# Patient Record
Sex: Male | Born: 1945 | Race: White | Hispanic: No | Marital: Married | Smoking: Never smoker
Health system: Southern US, Community
[De-identification: ages and names within clinical notes are randomized; demographics above are authoritative.]

## PROBLEM LIST (undated history)

## (undated) DIAGNOSIS — E669 Obesity, unspecified: Secondary | ICD-10-CM

## (undated) DIAGNOSIS — E785 Hyperlipidemia, unspecified: Secondary | ICD-10-CM

## (undated) DIAGNOSIS — G4730 Sleep apnea, unspecified: Secondary | ICD-10-CM

## (undated) DIAGNOSIS — E079 Disorder of thyroid, unspecified: Secondary | ICD-10-CM

## (undated) DIAGNOSIS — E119 Type 2 diabetes mellitus without complications: Secondary | ICD-10-CM

## (undated) DIAGNOSIS — K219 Gastro-esophageal reflux disease without esophagitis: Secondary | ICD-10-CM

## (undated) DIAGNOSIS — I10 Essential (primary) hypertension: Secondary | ICD-10-CM

## (undated) HISTORY — DX: Disorder of thyroid, unspecified: E07.9

## (undated) HISTORY — DX: Essential (primary) hypertension: I10

## (undated) HISTORY — DX: Hyperlipidemia, unspecified: E78.5

## (undated) HISTORY — PX: OTHER SURGICAL HISTORY: SHX169

## (undated) HISTORY — DX: Gastro-esophageal reflux disease without esophagitis: K21.9

## (undated) HISTORY — DX: Type 2 diabetes mellitus without complications: E11.9

## (undated) HISTORY — DX: Sleep apnea, unspecified: G47.30

## (undated) HISTORY — DX: Obesity, unspecified: E66.9

---

## 1998-01-16 ENCOUNTER — Ambulatory Visit (HOSPITAL_COMMUNITY): Admission: RE | Admit: 1998-01-16 | Discharge: 1998-01-16 | Payer: Self-pay | Admitting: Family Medicine

## 1998-01-16 ENCOUNTER — Encounter: Payer: Self-pay | Admitting: Family Medicine

## 2000-04-04 ENCOUNTER — Ambulatory Visit (HOSPITAL_COMMUNITY): Admission: RE | Admit: 2000-04-04 | Discharge: 2000-04-04 | Payer: Self-pay | Admitting: Family Medicine

## 2000-04-04 ENCOUNTER — Encounter: Payer: Self-pay | Admitting: Family Medicine

## 2001-08-15 ENCOUNTER — Encounter: Payer: Self-pay | Admitting: Family Medicine

## 2001-08-15 ENCOUNTER — Ambulatory Visit (HOSPITAL_COMMUNITY): Admission: RE | Admit: 2001-08-15 | Discharge: 2001-08-15 | Payer: Self-pay | Admitting: Family Medicine

## 2002-02-22 ENCOUNTER — Encounter: Payer: Self-pay | Admitting: Family Medicine

## 2002-02-22 ENCOUNTER — Ambulatory Visit (HOSPITAL_COMMUNITY): Admission: RE | Admit: 2002-02-22 | Discharge: 2002-02-22 | Payer: Self-pay | Admitting: Family Medicine

## 2003-12-02 ENCOUNTER — Ambulatory Visit (HOSPITAL_COMMUNITY): Admission: RE | Admit: 2003-12-02 | Discharge: 2003-12-02 | Payer: Self-pay | Admitting: Family Medicine

## 2004-03-06 ENCOUNTER — Ambulatory Visit (HOSPITAL_COMMUNITY): Admission: RE | Admit: 2004-03-06 | Discharge: 2004-03-06 | Payer: Self-pay | Admitting: Family Medicine

## 2004-03-25 ENCOUNTER — Ambulatory Visit (HOSPITAL_COMMUNITY): Admission: RE | Admit: 2004-03-25 | Discharge: 2004-03-25 | Payer: Self-pay | Admitting: Gastroenterology

## 2004-10-25 ENCOUNTER — Encounter: Admission: RE | Admit: 2004-10-25 | Discharge: 2004-10-25 | Payer: Self-pay | Admitting: Interventional Cardiology

## 2004-11-01 ENCOUNTER — Inpatient Hospital Stay (HOSPITAL_BASED_OUTPATIENT_CLINIC_OR_DEPARTMENT_OTHER): Admission: RE | Admit: 2004-11-01 | Discharge: 2004-11-01 | Payer: Self-pay | Admitting: Interventional Cardiology

## 2005-01-14 ENCOUNTER — Ambulatory Visit: Payer: Self-pay | Admitting: Pulmonary Disease

## 2005-03-02 ENCOUNTER — Ambulatory Visit: Payer: Self-pay | Admitting: Pulmonary Disease

## 2005-03-07 ENCOUNTER — Ambulatory Visit: Admission: RE | Admit: 2005-03-07 | Discharge: 2005-03-07 | Payer: Self-pay | Admitting: Pulmonary Disease

## 2005-03-08 ENCOUNTER — Ambulatory Visit: Payer: Self-pay | Admitting: Cardiology

## 2005-03-17 ENCOUNTER — Ambulatory Visit: Payer: Self-pay | Admitting: Pulmonary Disease

## 2005-07-11 ENCOUNTER — Ambulatory Visit: Payer: Self-pay | Admitting: Pulmonary Disease

## 2011-05-02 DIAGNOSIS — H04129 Dry eye syndrome of unspecified lacrimal gland: Secondary | ICD-10-CM | POA: Diagnosis not present

## 2011-05-02 DIAGNOSIS — E119 Type 2 diabetes mellitus without complications: Secondary | ICD-10-CM | POA: Diagnosis not present

## 2012-10-25 ENCOUNTER — Other Ambulatory Visit (HOSPITAL_COMMUNITY): Payer: Self-pay | Admitting: Interventional Cardiology

## 2012-10-25 ENCOUNTER — Ambulatory Visit (HOSPITAL_COMMUNITY): Payer: BC Managed Care – PPO | Attending: Interventional Cardiology | Admitting: Radiology

## 2012-10-25 DIAGNOSIS — R0609 Other forms of dyspnea: Secondary | ICD-10-CM | POA: Insufficient documentation

## 2012-10-25 DIAGNOSIS — R0989 Other specified symptoms and signs involving the circulatory and respiratory systems: Secondary | ICD-10-CM | POA: Insufficient documentation

## 2012-10-25 DIAGNOSIS — R0602 Shortness of breath: Secondary | ICD-10-CM

## 2012-10-25 NOTE — Progress Notes (Signed)
Echocardiogram performed.  

## 2013-04-19 DIAGNOSIS — L821 Other seborrheic keratosis: Secondary | ICD-10-CM | POA: Diagnosis not present

## 2013-04-19 DIAGNOSIS — D239 Other benign neoplasm of skin, unspecified: Secondary | ICD-10-CM | POA: Diagnosis not present

## 2013-04-19 DIAGNOSIS — M6740 Ganglion, unspecified site: Secondary | ICD-10-CM | POA: Diagnosis not present

## 2013-04-19 DIAGNOSIS — L819 Disorder of pigmentation, unspecified: Secondary | ICD-10-CM | POA: Diagnosis not present

## 2013-04-19 DIAGNOSIS — D1801 Hemangioma of skin and subcutaneous tissue: Secondary | ICD-10-CM | POA: Diagnosis not present

## 2013-04-19 DIAGNOSIS — L738 Other specified follicular disorders: Secondary | ICD-10-CM | POA: Diagnosis not present

## 2013-08-13 DIAGNOSIS — E119 Type 2 diabetes mellitus without complications: Secondary | ICD-10-CM | POA: Diagnosis not present

## 2013-08-13 DIAGNOSIS — R609 Edema, unspecified: Secondary | ICD-10-CM | POA: Diagnosis not present

## 2013-08-13 DIAGNOSIS — E039 Hypothyroidism, unspecified: Secondary | ICD-10-CM | POA: Diagnosis not present

## 2013-08-13 DIAGNOSIS — E785 Hyperlipidemia, unspecified: Secondary | ICD-10-CM | POA: Diagnosis not present

## 2013-08-13 DIAGNOSIS — Z6841 Body Mass Index (BMI) 40.0 and over, adult: Secondary | ICD-10-CM | POA: Diagnosis not present

## 2013-08-13 DIAGNOSIS — E669 Obesity, unspecified: Secondary | ICD-10-CM | POA: Diagnosis not present

## 2013-08-26 DIAGNOSIS — L03119 Cellulitis of unspecified part of limb: Secondary | ICD-10-CM | POA: Diagnosis not present

## 2013-08-26 DIAGNOSIS — L02419 Cutaneous abscess of limb, unspecified: Secondary | ICD-10-CM | POA: Diagnosis not present

## 2013-09-03 DIAGNOSIS — J309 Allergic rhinitis, unspecified: Secondary | ICD-10-CM | POA: Diagnosis not present

## 2013-09-03 DIAGNOSIS — M109 Gout, unspecified: Secondary | ICD-10-CM | POA: Diagnosis not present

## 2013-09-03 DIAGNOSIS — L02419 Cutaneous abscess of limb, unspecified: Secondary | ICD-10-CM | POA: Diagnosis not present

## 2013-09-03 DIAGNOSIS — I10 Essential (primary) hypertension: Secondary | ICD-10-CM | POA: Diagnosis not present

## 2013-12-03 DIAGNOSIS — Z23 Encounter for immunization: Secondary | ICD-10-CM | POA: Diagnosis not present

## 2013-12-06 ENCOUNTER — Encounter: Payer: Self-pay | Admitting: Interventional Cardiology

## 2013-12-06 ENCOUNTER — Ambulatory Visit (INDEPENDENT_AMBULATORY_CARE_PROVIDER_SITE_OTHER): Payer: Medicare Other | Admitting: Interventional Cardiology

## 2013-12-06 VITALS — BP 114/80 | HR 55 | Ht 72.0 in | Wt 315.1 lb

## 2013-12-06 DIAGNOSIS — G4733 Obstructive sleep apnea (adult) (pediatric): Secondary | ICD-10-CM | POA: Insufficient documentation

## 2013-12-06 DIAGNOSIS — I10 Essential (primary) hypertension: Secondary | ICD-10-CM

## 2013-12-06 DIAGNOSIS — I452 Bifascicular block: Secondary | ICD-10-CM | POA: Insufficient documentation

## 2013-12-06 MED ORDER — AMLODIPINE BESYLATE 5 MG PO TABS: 5.0000 mg | ORAL_TABLET | Freq: Every day | ORAL | Status: AC

## 2013-12-06 NOTE — Progress Notes (Signed)
Patient ID: Richard Berry, male   DOB: 09/22/1945, 68 y.o.   MRN: [id number]    [address], [address], [place]  [address] Phone: [phone] Fax:  [phone]  Date:  12/06/2013   ID:  Richard Berry, DOB 12-28-1945, MRN [id number]  PCP:  No primary care provider on file.   ASSESSMENT:  1. Essential hypertension, controlled 2. 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 Morbid obesity 4. Obstructive sleep apnea with insomnia and depression 5. Fatigue and decreased exertional tolerance likely related to sleep deprivation  PLAN:  1. Resume amlodipine 5 mg per day 2. Clinical follow-up with me in a year 3. No cardiac workup is needed for fatigue. I believe it is related to sleep disturbance. Probably needs to have follow-up with a sleep physician.   SUBJECTIVE: Richard Berry is a 68 y.o. male who is a currently by his wife. They were concerned because he has fatigue especially when he wakes up in the morning. He has no energy to do things. He has not had lightheadedness or dizziness. His blood pressures were tending to run under 950 systolic. For this reason, amlodipine was discontinued. His blood pressures are now running consistently around 932 mmHg systolic and he doesn't feel any better. He has a hard time falling off to sleep at night. He is wearing his C Pap device. He disc at no equipment. This no peripheral edema.   Wt Readings from Last 3 Encounters:  12/06/13 315 lb 1.9 oz (142.937 kg)     No past medical history on file.  Current Outpatient Prescriptions  Medication Sig Dispense Refill  . allopurinol (ZYLOPRIM) 100 MG tablet Take 100 mg by mouth daily. For gout    . amLODipine (NORVASC) 5 MG tablet Take 5 mg by mouth daily.    Marland Kitchen atenolol (TENORMIN) 50 MG tablet Take 50 mg by mouth daily.  0  . beta carotene w/minerals (OCUVITE) tablet Take 1 tablet by mouth 2 (two) times daily.    . Cholecalciferol (D-3-5) 5000 UNITS capsule Take  5,000 Units by mouth daily.    Marland Kitchen dutasteride (AVODART) 0.5 MG capsule Take 0.5 mg by mouth daily.    . hydrochlorothiazide (HYDRODIURIL) 12.5 MG tablet Take 12.5 mg by mouth daily as needed.    Marland Kitchen JANUMET 50-1000 MG per tablet   4  . levothyroxine (SYNTHROID, LEVOTHROID) 50 MCG tablet Take 50 mcg by mouth daily.  1  . meloxicam (MOBIC) 15 MG tablet Take 15 mg by mouth daily.  0  . rosuvastatin (CRESTOR) 10 MG tablet Take 10 mg by mouth daily.     No current facility-administered medications for this visit.    Allergies:   Allergies not on file  Social History:  The patient  reports that he has never smoked. He does not have any smokeless tobacco history on file. He reports that he does not drink alcohol.   ROS:  Please see the history of present illness.   Stable/excessive appetite. No edema. No syncope. Denies chest pain.   All other systems reviewed and negative.   OBJECTIVE: VS:  BP 114/80 mmHg  Pulse 55  Ht 6' (1.829 m)  Wt 315 lb 1.9 oz (142.937 kg)  BMI 42.73 kg/m2 Well nourished, well developed, in no acute distress, morbidly obese HEENT: normal Neck: JVD flat. Carotid bruit absent  Cardiac:  normal S1, S2; RRR; no murmur Lungs:  clear to auscultation bilaterally, no wheezing, rhonchi or rales Abd:  soft, nontender, no hepatomegaly Ext: Edema absent. Pulses 2+ Skin: warm and dry Neuro:  CNs 2-12 intact, no focal abnormalities noted  EKG:  Sinus bradycardia, first-degree AV block, right bundle branch block, premature ventricular contractions.       Signed, Illene Labrador III, MD 12/06/2013 12:08 PM

## 2013-12-06 NOTE — Patient Instructions (Signed)
Your physician recommends that you continue on your current medications as directed. Please refer to the Current Medication list given to you today.  An Rx for Amlodipine 5mg  daily has been sent to your pharmacy  Your physician wants you to follow-up in: 1 year with  You will receive a reminder letter in the mail two months in advance. If you don't receive a letter, please call our office to schedule the follow-up appointment.

## 2013-12-12 DIAGNOSIS — N400 Enlarged prostate without lower urinary tract symptoms: Secondary | ICD-10-CM | POA: Diagnosis not present

## 2013-12-12 DIAGNOSIS — E039 Hypothyroidism, unspecified: Secondary | ICD-10-CM | POA: Diagnosis not present

## 2013-12-12 DIAGNOSIS — E782 Mixed hyperlipidemia: Secondary | ICD-10-CM | POA: Diagnosis not present

## 2013-12-12 DIAGNOSIS — I10 Essential (primary) hypertension: Secondary | ICD-10-CM | POA: Diagnosis not present

## 2013-12-12 DIAGNOSIS — G4700 Insomnia, unspecified: Secondary | ICD-10-CM | POA: Diagnosis not present

## 2013-12-12 DIAGNOSIS — M109 Gout, unspecified: Secondary | ICD-10-CM | POA: Diagnosis not present

## 2013-12-12 DIAGNOSIS — M25569 Pain in unspecified knee: Secondary | ICD-10-CM | POA: Diagnosis not present

## 2013-12-12 DIAGNOSIS — M545 Low back pain: Secondary | ICD-10-CM | POA: Diagnosis not present

## 2013-12-18 ENCOUNTER — Encounter: Payer: Medicare Other | Attending: Internal Medicine | Admitting: *Deleted

## 2013-12-18 ENCOUNTER — Encounter: Payer: Self-pay | Admitting: *Deleted

## 2013-12-18 VITALS — Ht 61.5 in | Wt 318.0 lb

## 2013-12-18 DIAGNOSIS — E119 Type 2 diabetes mellitus without complications: Secondary | ICD-10-CM | POA: Insufficient documentation

## 2013-12-18 DIAGNOSIS — Z713 Dietary counseling and surveillance: Secondary | ICD-10-CM | POA: Insufficient documentation

## 2013-12-18 NOTE — Patient Instructions (Signed)
Plan:  Aim for 3-4 Carb Choices per meal (45-60 grams) +/- 1 either way  Aim for 0-15 Carbs per snack if hungry  Include protein in moderation with your meals and snacks Consider reading food labels for Total Carbohydrate and Fat Grams of foods continueyour activity level by riding your bicycle as tolerated Consider checking BG at alternate times per day to include fasting and 2 hours after first bite of largest meal of the day as directed by MD  Continue taking medication as directed by MD  Calorie Edison Pace app  Cut down on the fried food Clarise Cruz [address] reduce calorie bread 45-50 calores also reduces carbs Use 9" plate, provide appropriate meal, don't go back for seconds, wait 20 minute...Marland KitchenMarland KitchenMarland Kitchen Am I HUNGRY ? Use take out box for your meal prior to eating...Marland KitchenMarland KitchenMarland Kitchen Think about MINDLESS EATING... Use Splenda to sweeten you tea and coffee  GOOD CHOICES & PORTION CONTROL

## 2013-12-19 ENCOUNTER — Encounter: Payer: Self-pay | Admitting: *Deleted

## 2013-12-19 NOTE — Addendum Note (Signed)
Addended by: Bernie Covey on: 12/19/2013 03:26 PM   Modules accepted: Medications

## 2013-12-19 NOTE — Progress Notes (Signed)
Diabetes Self-Management Education  Visit Type:  DSME  Appt. Start Time: 1400 Appt. End Time: 1600   12/19/2013  Richard Berry, identified by name and date of birth, is a 68 y.o. male with a diagnosis of Diabetes: Type 2.  Other people present during visit:  Spouse/SO. Richard Berry T2DM is reasonably well managed as reflected by A1c 6.0% and FBS 120mg /dl. However, he is morbidly obese. In discussion of his dietary intake it was identified that portion control is a primary contributor.  ASSESSMENT  Height 5' 1.5" (1.562 m), weight 318 lb (144.244 kg). Body mass index is 59.12 kg/(m^2).  Initial Visit Information:  Are you currently following a meal plan?: No   Are you taking your medications as prescribed?: Yes Are you checking your feet?: Yes How many days per week are you checking your feet?: 2 How often do you need to have someone help you when you read instructions, pamphlets, or other written materials from your doctor or pharmacy?: 1 - Never   Psychosocial:   Patient Belief/Attitude about Diabetes: Motivated to manage diabetes Self-care barriers: None Self-management support: Doctor's office, Family, CDE visits Other persons present: Spouse/SO Patient Concerns: Weight Control, Nutrition/Meal planning Special Needs: None Preferred Learning Style: No preference indicated Learning Readiness: Change in progress  Complications:   Last HgB A1C per patient/outside source: 6 mg/dL How often do you check your blood sugar?: 1-2 times/day Fasting Blood glucose range (mg/dL): 70-129 Have you had a dilated eye exam in the past 12 months?: Yes Have you had a dental exam in the past 12 months?: Yes  Diet Intake:  Breakfast: 2 large cranberry muffins Snack (morning): none Lunch: "Mayflower" fried food & baked potatoe Snack (afternoon): peanuts Dinner: Apple, raisins, evaporated milk, raw sugar, oatmeal 2C, wheat germ Snack (evening): frozen yogurt (sugar free) Beverage(s):  water, sweet tea, coffee/splenda  Exercise:  Exercise: Light (walking / raking leaves) (Bicycle) Light Exercise amount of time (min / week): 150  Individualized Plan for Diabetes Self-Management Training:   Learning Objective:  Patient will have a greater understanding of diabetes self-management.  Patient education plan per assessed needs and concerns is to attend individual sessions     Education Topics Reviewed with Patient Today:  Definition of diabetes, type 1 and 2, and the diagnosis of diabetes, Factors that contribute to the development of diabetes, Explored patient's options for treatment of their diabetes Role of diet in the treatment of diabetes and the relationship between the three main macronutrients and blood glucose level, Food label reading, portion sizes and measuring food., Carbohydrate counting, Reviewed blood glucose goals for pre and post meals and how to evaluate the patients' food intake on their blood glucose level. Role of exercise on diabetes management, blood pressure control and cardiac health., Helped patient identify appropriate exercises in relation to his/her diabetes, diabetes complications and other health issue. Reviewed patients medication for diabetes, action, purpose, timing of dose and side effects. Purpose and frequency of SMBG., Yearly dilated eye exam, Daily foot exams, Identified appropriate SMBG and/or A1C goals.   Relationship between chronic complications and blood glucose control, Assessed and discussed foot care and prevention of foot problems, Lipid levels, blood glucose control and heart disease, Identified and discussed with patient  current chronic complications, Dental care, Retinopathy and reason for yearly dilated eye exams, Nephropathy, what it is, prevention of, the use of ACE, ARB's and early detection of through urine microalbumia., Reviewed with patient heart disease, higher risk of, and prevention Travel strategies, Helped  patient  identify a support system for diabetes management, Role of stress on diabetes, Brainstormed with patient on coping mechanisms for social situations, getting support from significant others, dealing with feelings about diabetes     PATIENTS GOALS/Plan (Developed by the patient):  Nutrition: General guidelines for healthy choices and portions discussed Physical Activity: Exercise 3-5 times per week, 30 minutes per day Medications: take my medication as prescribed Monitoring : test my blood glucose as discussed (note x per day with comment)  Patient Instructions  Plan:  Aim for 3-4 Carb Choices per meal (45-60 grams) +/- 1 either way  Aim for 0-15 Carbs per snack if hungry  Include protein in moderation with your meals and snacks Consider reading food labels for Total Carbohydrate and Fat Grams of foods continueyour activity level by riding your bicycle as tolerated Consider checking BG at alternate times per day to include fasting and 2 hours after first bite of largest meal of the day as directed by MD  Continue taking medication as directed by MD  Calorie Edison Pace app  Cut down on the fried food Clarise Cruz Lee/[address] reduce calorie bread 45-50 calores also reduces carbs Use 9" plate, provide appropriate meal, don't go back for seconds, wait 20 minute...Marland KitchenMarland KitchenMarland Kitchen Am I HUNGRY ? Use take out box for your meal prior to eating...Marland KitchenMarland KitchenMarland Kitchen Think about MINDLESS EATING... Use Splenda to sweeten you tea and coffee  GOOD CHOICES & PORTION CONTROL  Expected Outcomes:  Demonstrated interest in learning. Expect positive outcomes  Education material provided: Living Well with Diabetes, A1C conversion sheet, Meal plan card, My Plate, Snack sheet and Support group flyer  If problems or questions, patient to contact team via:  Phone  Future DSME appointment: 2 months

## 2014-02-13 ENCOUNTER — Ambulatory Visit: Payer: Medicare Other | Admitting: *Deleted

## 2014-02-13 DIAGNOSIS — E039 Hypothyroidism, unspecified: Secondary | ICD-10-CM | POA: Diagnosis not present

## 2014-02-13 DIAGNOSIS — E119 Type 2 diabetes mellitus without complications: Secondary | ICD-10-CM | POA: Diagnosis not present

## 2014-02-24 DIAGNOSIS — J111 Influenza due to unidentified influenza virus with other respiratory manifestations: Secondary | ICD-10-CM | POA: Diagnosis not present

## 2014-02-24 DIAGNOSIS — R509 Fever, unspecified: Secondary | ICD-10-CM | POA: Diagnosis not present

## 2014-03-06 DIAGNOSIS — J069 Acute upper respiratory infection, unspecified: Secondary | ICD-10-CM | POA: Diagnosis not present

## 2014-03-06 DIAGNOSIS — E039 Hypothyroidism, unspecified: Secondary | ICD-10-CM | POA: Diagnosis not present

## 2014-03-06 DIAGNOSIS — E559 Vitamin D deficiency, unspecified: Secondary | ICD-10-CM | POA: Diagnosis not present

## 2014-03-06 DIAGNOSIS — M109 Gout, unspecified: Secondary | ICD-10-CM | POA: Diagnosis not present

## 2014-03-06 DIAGNOSIS — Z23 Encounter for immunization: Secondary | ICD-10-CM | POA: Diagnosis not present

## 2014-03-06 DIAGNOSIS — K219 Gastro-esophageal reflux disease without esophagitis: Secondary | ICD-10-CM | POA: Diagnosis not present

## 2014-03-06 DIAGNOSIS — I10 Essential (primary) hypertension: Secondary | ICD-10-CM | POA: Diagnosis not present

## 2014-03-06 DIAGNOSIS — G4730 Sleep apnea, unspecified: Secondary | ICD-10-CM | POA: Diagnosis not present

## 2014-03-06 DIAGNOSIS — N400 Enlarged prostate without lower urinary tract symptoms: Secondary | ICD-10-CM | POA: Diagnosis not present

## 2014-03-06 DIAGNOSIS — E1159 Type 2 diabetes mellitus with other circulatory complications: Secondary | ICD-10-CM | POA: Diagnosis not present

## 2014-03-06 DIAGNOSIS — E782 Mixed hyperlipidemia: Secondary | ICD-10-CM | POA: Diagnosis not present

## 2014-03-06 DIAGNOSIS — I2510 Atherosclerotic heart disease of native coronary artery without angina pectoris: Secondary | ICD-10-CM | POA: Diagnosis not present

## 2014-04-18 DIAGNOSIS — J029 Acute pharyngitis, unspecified: Secondary | ICD-10-CM | POA: Diagnosis not present

## 2014-04-19 ENCOUNTER — Encounter (HOSPITAL_COMMUNITY): Payer: Self-pay | Admitting: Emergency Medicine

## 2014-04-19 ENCOUNTER — Emergency Department (INDEPENDENT_AMBULATORY_CARE_PROVIDER_SITE_OTHER)
Admission: EM | Admit: 2014-04-19 | Discharge: 2014-04-19 | Disposition: A | Discharge status: Home / Self Care | Payer: Medicare Other | Source: Home / Self Care | Attending: Emergency Medicine | Admitting: Emergency Medicine

## 2014-04-19 DIAGNOSIS — H109 Unspecified conjunctivitis: Secondary | ICD-10-CM

## 2014-04-19 MED ORDER — POLYMYXIN B-TRIMETHOPRIM 10000-0.1 UNIT/ML-% OP SOLN: 1.0000 [drp] | OPHTHALMIC | Status: DC

## 2014-04-19 NOTE — ED Notes (Signed)
Pt states that he has pink eye in his left eye notice the redness to his eye and drainage this morning.

## 2014-04-19 NOTE — ED Provider Notes (Signed)
CSN: [id number]     Arrival date & time 04/19/14  1853 History   First MD Initiated Contact with Patient 04/19/14 1913     Chief Complaint  Patient presents with  . Conjunctivitis   (Consider location/radiation/quality/duration/timing/severity/associated sxs/prior Treatment) HPI He is a 69 year old man here for evaluation of left eye irritation. He states he has had a sore throat and laryngitis for the last few days. He was seen for this by his doctor yesterday and diagnosed with viral pharyngitis. A rapid strep test was negative. He has been using Chloraseptic spray. He thinks that last night Chloraseptic spray leaked out from his CPAP mask and got in his left eye. When he woke up this morning his left eye was red and irritated. He denies any change in his vision. He has been using lubricating eyedrops.  Past Medical History  Diagnosis Date  . Hyperlipidemia   . Hypertension   . Diabetes mellitus without complication   . Sleep apnea   . Sleep apnea   . Obesity   . Thyroid disease   . GERD (gastroesophageal reflux disease)    Past Surgical History  Procedure Laterality Date  . None     Family History  Problem Relation Age of Onset  . Hypertension Other   . Hyperlipidemia Other   . Heart disease Other   . Diabetes Other    History  Substance Use Topics  . Smoking status: Never Smoker   . Smokeless tobacco: Not on file  . Alcohol Use: No    Review of Systems  Constitutional: Negative for fever and chills.  HENT: Positive for sore throat and voice change. Negative for congestion and rhinorrhea.   Eyes: Positive for pain and redness. Negative for photophobia, discharge and visual disturbance.  Respiratory: Negative for cough.     Allergies  Cozaar; Flomax; Lotensin; Maxzide; and Ppd  Home Medications   Prior to Admission medications   Medication Sig Start Date End Date Taking? Authorizing Provider  allopurinol (ZYLOPRIM) 100 MG tablet Take 100 mg by mouth daily. For  gout 12/03/13   Historical Provider, MD  amLODipine (NORVASC) 5 MG tablet Take 1 tablet (5 mg total) by mouth daily. 12/06/13   Belva Crome, MD  aspirin 81 MG tablet Take 81 mg by mouth at bedtime.    Historical Provider, MD  atenolol (TENORMIN) 50 MG tablet Take 50 mg by mouth daily. 10/21/13   Historical Provider, MD  beta carotene w/minerals (OCUVITE) tablet Take 1 tablet by mouth 2 (two) times daily.    Historical Provider, MD  Cholecalciferol (D-3-5) 5000 UNITS capsule Take 5,000 Units by mouth daily.    Historical Provider, MD  dutasteride (AVODART) 0.5 MG capsule Take 0.5 mg by mouth daily.    Historical Provider, MD  hydrochlorothiazide (HYDRODIURIL) 12.5 MG tablet Take 12.5 mg by mouth daily as needed.    Historical Provider, MD  JANUMET 50-1000 MG per tablet 1 tablet 2 (two) times daily with a meal.  11/01/13   Historical Provider, MD  levothyroxine (SYNTHROID, LEVOTHROID) 50 MCG tablet Take 50 mcg by mouth daily. 10/21/13   Historical Provider, MD  meloxicam (MOBIC) 15 MG tablet Take 15 mg by mouth daily. 11/15/13   Historical Provider, MD  rosuvastatin (CRESTOR) 10 MG tablet Take 10 mg by mouth daily.    Historical Provider, MD  trimethoprim-polymyxin b (POLYTRIM) ophthalmic solution Place 1 drop into the left eye every 4 (four) hours. For 5 days 04/19/14   Melony Overly, MD  BP 124/68 mmHg  Pulse 60  Temp(Src) 98.3 F (36.8 C) (Oral)  Resp 22  SpO2 95% Physical Exam  Constitutional: He is oriented to person, place, and time. He appears well-developed and well-nourished. No distress.  HENT:  Nose: Nose normal.  Mouth/Throat: Oropharynx is clear and moist. No oropharyngeal exudate.  Eyes: EOM are normal. Pupils are equal, round, and reactive to light. Right eye exhibits no discharge. Left eye exhibits no discharge. Right conjunctiva is not injected. Left conjunctiva is injected.  Neck: Neck supple.  Cardiovascular: Normal rate.   Pulmonary/Chest: Effort normal.  Lymphadenopathy:     He has no cervical adenopathy.  Neurological: He is alert and oriented to person, place, and time.    ED Course  Procedures (including critical care time) Labs Review Labs Reviewed - No data to display  Imaging Review No results found.   MDM   1. Conjunctivitis of left eye    Likely viral or irritant. We'll cover with Polytrim eyedrops. Follow-up as needed.    Melony Overly, MD 04/19/14 [phone]

## 2014-04-19 NOTE — Discharge Instructions (Signed)
You have conjunctivitis. Use the eyedrops every 6 hours for the next 5 days. You should see improvement in the next 2-3 days. If you start having changes in your vision or the pain is getting worse, please come back.

## 2014-05-08 DIAGNOSIS — D239 Other benign neoplasm of skin, unspecified: Secondary | ICD-10-CM | POA: Diagnosis not present

## 2014-05-08 DIAGNOSIS — L821 Other seborrheic keratosis: Secondary | ICD-10-CM | POA: Diagnosis not present

## 2014-05-08 DIAGNOSIS — B353 Tinea pedis: Secondary | ICD-10-CM | POA: Diagnosis not present

## 2014-05-08 DIAGNOSIS — L814 Other melanin hyperpigmentation: Secondary | ICD-10-CM | POA: Diagnosis not present

## 2014-05-08 DIAGNOSIS — D1800 Hemangioma unspecified site: Secondary | ICD-10-CM | POA: Diagnosis not present

## 2014-05-19 DIAGNOSIS — E119 Type 2 diabetes mellitus without complications: Secondary | ICD-10-CM | POA: Diagnosis not present

## 2014-08-19 DIAGNOSIS — N529 Male erectile dysfunction, unspecified: Secondary | ICD-10-CM | POA: Diagnosis not present

## 2014-08-19 DIAGNOSIS — E119 Type 2 diabetes mellitus without complications: Secondary | ICD-10-CM | POA: Diagnosis not present

## 2014-08-19 DIAGNOSIS — E039 Hypothyroidism, unspecified: Secondary | ICD-10-CM | POA: Diagnosis not present

## 2014-10-26 DIAGNOSIS — G5680 Other specified mononeuropathies of unspecified upper limb: Secondary | ICD-10-CM | POA: Diagnosis not present

## 2014-11-04 DIAGNOSIS — E782 Mixed hyperlipidemia: Secondary | ICD-10-CM | POA: Diagnosis not present

## 2014-11-04 DIAGNOSIS — I10 Essential (primary) hypertension: Secondary | ICD-10-CM | POA: Diagnosis not present

## 2014-11-04 DIAGNOSIS — M109 Gout, unspecified: Secondary | ICD-10-CM | POA: Diagnosis not present

## 2014-11-04 DIAGNOSIS — E1159 Type 2 diabetes mellitus with other circulatory complications: Secondary | ICD-10-CM | POA: Diagnosis not present

## 2014-11-04 DIAGNOSIS — N400 Enlarged prostate without lower urinary tract symptoms: Secondary | ICD-10-CM | POA: Diagnosis not present

## 2014-11-04 DIAGNOSIS — R609 Edema, unspecified: Secondary | ICD-10-CM | POA: Diagnosis not present

## 2014-11-04 DIAGNOSIS — D179 Benign lipomatous neoplasm, unspecified: Secondary | ICD-10-CM | POA: Diagnosis not present

## 2014-11-04 DIAGNOSIS — B356 Tinea cruris: Secondary | ICD-10-CM | POA: Diagnosis not present

## 2014-11-04 DIAGNOSIS — Z23 Encounter for immunization: Secondary | ICD-10-CM | POA: Diagnosis not present

## 2014-11-04 DIAGNOSIS — E039 Hypothyroidism, unspecified: Secondary | ICD-10-CM | POA: Diagnosis not present

## 2014-11-04 DIAGNOSIS — I2510 Atherosclerotic heart disease of native coronary artery without angina pectoris: Secondary | ICD-10-CM | POA: Diagnosis not present

## 2014-11-04 DIAGNOSIS — E559 Vitamin D deficiency, unspecified: Secondary | ICD-10-CM | POA: Diagnosis not present

## 2014-11-05 DIAGNOSIS — M4722 Other spondylosis with radiculopathy, cervical region: Secondary | ICD-10-CM | POA: Diagnosis not present

## 2014-11-05 DIAGNOSIS — M79641 Pain in right hand: Secondary | ICD-10-CM | POA: Diagnosis not present

## 2014-11-05 DIAGNOSIS — M1711 Unilateral primary osteoarthritis, right knee: Secondary | ICD-10-CM | POA: Diagnosis not present

## 2014-11-06 DIAGNOSIS — M5412 Radiculopathy, cervical region: Secondary | ICD-10-CM | POA: Diagnosis not present

## 2014-11-18 DIAGNOSIS — M5412 Radiculopathy, cervical region: Secondary | ICD-10-CM | POA: Diagnosis not present

## 2014-11-20 DIAGNOSIS — M5412 Radiculopathy, cervical region: Secondary | ICD-10-CM | POA: Diagnosis not present

## 2014-11-25 DIAGNOSIS — M5412 Radiculopathy, cervical region: Secondary | ICD-10-CM | POA: Diagnosis not present

## 2014-11-27 DIAGNOSIS — M5412 Radiculopathy, cervical region: Secondary | ICD-10-CM | POA: Diagnosis not present

## 2014-12-02 DIAGNOSIS — M5412 Radiculopathy, cervical region: Secondary | ICD-10-CM | POA: Diagnosis not present

## 2014-12-04 DIAGNOSIS — M5412 Radiculopathy, cervical region: Secondary | ICD-10-CM | POA: Diagnosis not present

## 2014-12-09 DIAGNOSIS — M5412 Radiculopathy, cervical region: Secondary | ICD-10-CM | POA: Diagnosis not present

## 2014-12-11 DIAGNOSIS — M5412 Radiculopathy, cervical region: Secondary | ICD-10-CM | POA: Diagnosis not present

## 2014-12-12 DIAGNOSIS — M5412 Radiculopathy, cervical region: Secondary | ICD-10-CM | POA: Diagnosis not present

## 2014-12-17 DIAGNOSIS — M5412 Radiculopathy, cervical region: Secondary | ICD-10-CM | POA: Diagnosis not present

## 2015-01-06 DIAGNOSIS — B354 Tinea corporis: Secondary | ICD-10-CM | POA: Diagnosis not present

## 2015-01-06 DIAGNOSIS — L821 Other seborrheic keratosis: Secondary | ICD-10-CM | POA: Diagnosis not present

## 2015-01-12 DIAGNOSIS — G4733 Obstructive sleep apnea (adult) (pediatric): Secondary | ICD-10-CM | POA: Diagnosis not present

## 2015-01-16 ENCOUNTER — Ambulatory Visit (INDEPENDENT_AMBULATORY_CARE_PROVIDER_SITE_OTHER): Payer: Medicare Other | Admitting: Interventional Cardiology

## 2015-01-16 ENCOUNTER — Encounter: Payer: Self-pay | Admitting: Interventional Cardiology

## 2015-01-16 VITALS — BP 136/80 | HR 54 | Ht 71.5 in | Wt 317.4 lb

## 2015-01-16 DIAGNOSIS — G4733 Obstructive sleep apnea (adult) (pediatric): Secondary | ICD-10-CM

## 2015-01-16 DIAGNOSIS — I452 Bifascicular block: Secondary | ICD-10-CM | POA: Diagnosis not present

## 2015-01-16 DIAGNOSIS — I10 Essential (primary) hypertension: Secondary | ICD-10-CM

## 2015-01-16 NOTE — Progress Notes (Signed)
Cardiology Office Note   Date:  01/16/2015   ID:  Richard Berry, DOB 1945-03-30, MRN [id number]  PCP:  Gara Kroner, MD  Cardiologist:  Sinclair Grooms, MD   Chief Complaint  Patient presents with  . Hypertension      History of Present Illness: Richard Berry is a 69 y.o. male who presents for morbid obesity, sleep apnea, bifascicular block on EKG, essential hypertension, suspected diastolic heart failure, 3and additional cardiovascular risk factor diabetes mellitus, type II.  Richard Berry has no complaints. He has some difficulty with ambulation due to obesity. He denies chest discomfort. There is moderate dyspnea with exertion but no orthopnea. He denies lower extremity edema. We have previously prescribed low-dose diuretic therapy if he has edema. This also seems to help shortness of breath. More recently with reduction and salt and fluid intake, diuretic requirements have significantly decreased.    Past Medical History  Diagnosis Date  . Hyperlipidemia   . Hypertension   . Diabetes mellitus without complication ([place])   . Sleep apnea   . Sleep apnea   . Obesity   . Thyroid disease   . GERD (gastroesophageal reflux disease)     Past Surgical History  Procedure Laterality Date  . None       Current Outpatient Prescriptions  Medication Sig Dispense Refill  . allopurinol (ZYLOPRIM) 100 MG tablet Take 100 mg by mouth daily. For gout    . amLODipine (NORVASC) 5 MG tablet Take 1 tablet (5 mg total) by mouth daily. 30 tablet 11  . atenolol (TENORMIN) 50 MG tablet Take 50 mg by mouth daily.  0  . atorvastatin (LIPITOR) 40 MG tablet Take 1 tablet by mouth at bedtime.  3  . beta carotene w/minerals (OCUVITE) tablet Take 1 tablet by mouth 2 (two) times daily.    . Cholecalciferol (D-3-5) 5000 UNITS capsule Take 5,000 Units by mouth daily.    . cyclobenzaprine (FLEXERIL) 10 MG tablet Take 10 mg by mouth 3 (three) times daily as needed for muscle spasms.    . finasteride  (PROSCAR) 5 MG tablet TAKE 1 TABLET BY MOUTH EVERY MORNING  1  . fluconazole (DIFLUCAN) 200 MG tablet TK 1 T PO 1 TIME A WEEK FOR 4 WKS  0  . hydrochlorothiazide (HYDRODIURIL) 12.5 MG tablet Take 12.5 mg by mouth daily as needed (FOR FLUID).     Marland Kitchen JANUMET 50-1000 MG per tablet 1 tablet 2 (two) times daily with a meal.   4  . levothyroxine (SYNTHROID, LEVOTHROID) 50 MCG tablet Take 50 mcg by mouth daily.  1  . meloxicam (MOBIC) 15 MG tablet Take 15 mg by mouth daily.  0  . traMADol (ULTRAM) 50 MG tablet TAKE 1 TABLET BY MOUTH EVERY 6 HOURS AS NEEDED FOR PAIN  0  . trimethoprim-polymyxin b (POLYTRIM) ophthalmic solution Place 1 drop into the left eye every 4 (four) hours. For 5 days 10 mL 0   No current facility-administered medications for this visit.    Allergies:   Cozaar; Flomax; Lotensin; Maxzide; and Ppd    Social History:  The patient  reports that he has never smoked. He does not have any smokeless tobacco history on file. He reports that he does not drink alcohol.   Family History:  The patient's family history includes Blindness in his mother; Diabetes in his other and sister; Healthy in his brother; Heart attack in his father; Heart disease in his father, mother, other, and sister; Hyperlipidemia in  his other; Hypertension in his other.    ROS:  Please see the history of present illness.   Otherwise, review of systems are positive for none.   All other systems are reviewed and negative.    PHYSICAL EXAM: VS:  BP 136/80 mmHg  Pulse 54  Ht 5' 11.5" (1.816 m)  Wt 317 lb 6.4 oz (143.972 kg)  BMI 43.66 kg/m2 , BMI Body mass index is 43.66 kg/(m^2). GEN: Well nourished, well developed, in no acute distress HEENT: normal Neck: no JVD, carotid bruits, or masses Cardiac: RRR.  There is no murmur, rub, or gallop. There is no edema. Respiratory:  clear to auscultation bilaterally, normal work of breathing. GI: soft, nontender, nondistended, + BS MS: no deformity or atrophy Skin:  warm and dry, no rash Neuro:  Strength and sensation are intact Psych: euthymic mood, full affect   EKG:  EKG sinus bradycardia ordered today. The ekg reveals right bundle branch block, first-degree AV block, left anterior hemiblock   Recent Labs: No results found for requested labs within last 365 days.    Lipid Panel No results found for: CHOL, TRIG, HDL, CHOLHDL, VLDL, LDLCALC, LDLDIRECT    Wt Readings from Last 3 Encounters:  01/16/15 317 lb 6.4 oz (143.972 kg)  12/18/13 318 lb (144.244 kg)  12/06/13 315 lb 1.9 oz (142.937 kg)      Other studies Reviewed: Additional studies/ records that were reviewed today include: Reviewed laboratory data from [facility]. The findings include all laboratory data including lipid panel (LDL 32), vitamin D, kidney function, electrolytes, liver function, uric acid, and CBC were all normal from October 2016    ASSESSMENT AND PLAN:  1. Trifascicular block Asymptomatic   2. Obstructive sleep apnea C-PAP   3. Essential hypertension  Excellent control - EKG 12-Lead    Current medicines are reviewed at length with the patient today.  The patient has the following concerns regarding medicines:  None. Also spoke to him about concerns regarding annual cardiology follow-up.then not mandatory and I expressed this to him. He would like to continue annual follow-up.  The following changes/actions have been instituted:    Advised to notify us of sudden lightheadedness, or episodes of syncope.  We discussed the natural history of conduction system disease and the possible future requirement for permanent pacemaker therapy.  Labs/ tests ordered today include:  Orders Placed This Encounter  Procedures  . EKG 12-Lead     Disposition:   FU with HS in 1 year  Signed, Sinclair Grooms, MD  01/16/2015 8:54 AM    [facility] [address], [place], [place]  [address] Phone: [phone]; Fax: [phone]

## 2015-01-16 NOTE — Patient Instructions (Signed)
Medication Instructions:  Your physician recommends that you continue on your current medications as directed. Please refer to the Current Medication list given to you today.   Labwork: None ordered  Testing/Procedures: None ordered  Follow-Up: Your physician wants you to follow-up in: 1 YEAR WITH DR. SMITH  You will receive a reminder letter in the mail two months in advance. If you don't receive a letter, please call our office to schedule the follow-up appointment.   Any Other Special Instructions Will Be Listed Below (If Applicable).    If you need a refill on your cardiac medications before your next appointment, please call your pharmacy.   

## 2015-02-05 DIAGNOSIS — B354 Tinea corporis: Secondary | ICD-10-CM | POA: Diagnosis not present

## 2015-02-16 DIAGNOSIS — E119 Type 2 diabetes mellitus without complications: Secondary | ICD-10-CM | POA: Diagnosis not present

## 2015-02-16 DIAGNOSIS — Z7984 Long term (current) use of oral hypoglycemic drugs: Secondary | ICD-10-CM | POA: Diagnosis not present

## 2015-02-16 DIAGNOSIS — E039 Hypothyroidism, unspecified: Secondary | ICD-10-CM | POA: Diagnosis not present

## 2015-05-25 DIAGNOSIS — E119 Type 2 diabetes mellitus without complications: Secondary | ICD-10-CM | POA: Diagnosis not present

## 2015-05-26 DIAGNOSIS — G4730 Sleep apnea, unspecified: Secondary | ICD-10-CM | POA: Diagnosis not present

## 2015-05-26 DIAGNOSIS — E782 Mixed hyperlipidemia: Secondary | ICD-10-CM | POA: Diagnosis not present

## 2015-05-26 DIAGNOSIS — Z0000 Encounter for general adult medical examination without abnormal findings: Secondary | ICD-10-CM | POA: Diagnosis not present

## 2015-05-26 DIAGNOSIS — F430 Acute stress reaction: Secondary | ICD-10-CM | POA: Diagnosis not present

## 2015-05-26 DIAGNOSIS — E1159 Type 2 diabetes mellitus with other circulatory complications: Secondary | ICD-10-CM | POA: Diagnosis not present

## 2015-05-26 DIAGNOSIS — E039 Hypothyroidism, unspecified: Secondary | ICD-10-CM | POA: Diagnosis not present

## 2015-05-26 DIAGNOSIS — I10 Essential (primary) hypertension: Secondary | ICD-10-CM | POA: Diagnosis not present

## 2015-05-26 DIAGNOSIS — M109 Gout, unspecified: Secondary | ICD-10-CM | POA: Diagnosis not present

## 2015-05-26 DIAGNOSIS — K219 Gastro-esophageal reflux disease without esophagitis: Secondary | ICD-10-CM | POA: Diagnosis not present

## 2015-05-26 DIAGNOSIS — Z125 Encounter for screening for malignant neoplasm of prostate: Secondary | ICD-10-CM | POA: Diagnosis not present

## 2015-05-26 DIAGNOSIS — E559 Vitamin D deficiency, unspecified: Secondary | ICD-10-CM | POA: Diagnosis not present

## 2015-05-26 DIAGNOSIS — I2510 Atherosclerotic heart disease of native coronary artery without angina pectoris: Secondary | ICD-10-CM | POA: Diagnosis not present

## 2015-05-28 DIAGNOSIS — G4733 Obstructive sleep apnea (adult) (pediatric): Secondary | ICD-10-CM | POA: Diagnosis not present

## 2015-06-01 DIAGNOSIS — L814 Other melanin hyperpigmentation: Secondary | ICD-10-CM | POA: Diagnosis not present

## 2015-06-01 DIAGNOSIS — B354 Tinea corporis: Secondary | ICD-10-CM | POA: Diagnosis not present

## 2015-06-01 DIAGNOSIS — B351 Tinea unguium: Secondary | ICD-10-CM | POA: Diagnosis not present

## 2015-06-01 DIAGNOSIS — D1800 Hemangioma unspecified site: Secondary | ICD-10-CM | POA: Diagnosis not present

## 2015-06-01 DIAGNOSIS — L821 Other seborrheic keratosis: Secondary | ICD-10-CM | POA: Diagnosis not present

## 2015-06-01 DIAGNOSIS — D225 Melanocytic nevi of trunk: Secondary | ICD-10-CM | POA: Diagnosis not present

## 2015-09-23 DIAGNOSIS — D485 Neoplasm of uncertain behavior of skin: Secondary | ICD-10-CM | POA: Diagnosis not present

## 2015-09-23 DIAGNOSIS — B351 Tinea unguium: Secondary | ICD-10-CM | POA: Diagnosis not present

## 2015-09-25 DIAGNOSIS — Z800 Family history of malignant neoplasm of digestive organs: Secondary | ICD-10-CM | POA: Diagnosis not present

## 2015-09-25 DIAGNOSIS — K5730 Diverticulosis of large intestine without perforation or abscess without bleeding: Secondary | ICD-10-CM | POA: Diagnosis not present

## 2015-09-25 DIAGNOSIS — Z1211 Encounter for screening for malignant neoplasm of colon: Secondary | ICD-10-CM | POA: Diagnosis not present

## 2015-09-25 DIAGNOSIS — K635 Polyp of colon: Secondary | ICD-10-CM | POA: Diagnosis not present

## 2015-09-25 DIAGNOSIS — D125 Benign neoplasm of sigmoid colon: Secondary | ICD-10-CM | POA: Diagnosis not present

## 2015-09-25 DIAGNOSIS — L308 Other specified dermatitis: Secondary | ICD-10-CM | POA: Diagnosis not present

## 2015-09-25 DIAGNOSIS — K648 Other hemorrhoids: Secondary | ICD-10-CM | POA: Diagnosis not present

## 2015-10-16 DIAGNOSIS — E119 Type 2 diabetes mellitus without complications: Secondary | ICD-10-CM | POA: Diagnosis not present

## 2015-10-16 DIAGNOSIS — I2510 Atherosclerotic heart disease of native coronary artery without angina pectoris: Secondary | ICD-10-CM | POA: Diagnosis not present

## 2015-10-16 DIAGNOSIS — M25561 Pain in right knee: Secondary | ICD-10-CM | POA: Diagnosis not present

## 2015-10-16 DIAGNOSIS — Z23 Encounter for immunization: Secondary | ICD-10-CM | POA: Diagnosis not present

## 2015-10-16 DIAGNOSIS — Z634 Disappearance and death of family member: Secondary | ICD-10-CM | POA: Diagnosis not present

## 2015-10-16 DIAGNOSIS — E781 Pure hyperglyceridemia: Secondary | ICD-10-CM | POA: Diagnosis not present

## 2015-10-16 DIAGNOSIS — Z7984 Long term (current) use of oral hypoglycemic drugs: Secondary | ICD-10-CM | POA: Diagnosis not present

## 2015-10-16 DIAGNOSIS — E039 Hypothyroidism, unspecified: Secondary | ICD-10-CM | POA: Diagnosis not present

## 2015-10-20 DIAGNOSIS — E781 Pure hyperglyceridemia: Secondary | ICD-10-CM | POA: Diagnosis not present

## 2015-10-20 DIAGNOSIS — E119 Type 2 diabetes mellitus without complications: Secondary | ICD-10-CM | POA: Diagnosis not present

## 2015-11-26 DIAGNOSIS — E039 Hypothyroidism, unspecified: Secondary | ICD-10-CM | POA: Diagnosis not present

## 2015-11-26 DIAGNOSIS — L600 Ingrowing nail: Secondary | ICD-10-CM | POA: Diagnosis not present

## 2015-11-26 DIAGNOSIS — I2510 Atherosclerotic heart disease of native coronary artery without angina pectoris: Secondary | ICD-10-CM | POA: Diagnosis not present

## 2015-11-26 DIAGNOSIS — Z7984 Long term (current) use of oral hypoglycemic drugs: Secondary | ICD-10-CM | POA: Diagnosis not present

## 2015-11-26 DIAGNOSIS — E119 Type 2 diabetes mellitus without complications: Secondary | ICD-10-CM | POA: Diagnosis not present

## 2015-11-26 DIAGNOSIS — L03031 Cellulitis of right toe: Secondary | ICD-10-CM | POA: Diagnosis not present

## 2015-11-30 DIAGNOSIS — M79674 Pain in right toe(s): Secondary | ICD-10-CM | POA: Diagnosis not present

## 2015-11-30 DIAGNOSIS — M79675 Pain in left toe(s): Secondary | ICD-10-CM | POA: Diagnosis not present

## 2015-11-30 DIAGNOSIS — L03031 Cellulitis of right toe: Secondary | ICD-10-CM | POA: Diagnosis not present

## 2015-12-14 DIAGNOSIS — M79675 Pain in left toe(s): Secondary | ICD-10-CM | POA: Diagnosis not present

## 2015-12-14 DIAGNOSIS — M79674 Pain in right toe(s): Secondary | ICD-10-CM | POA: Diagnosis not present

## 2015-12-15 DIAGNOSIS — E559 Vitamin D deficiency, unspecified: Secondary | ICD-10-CM | POA: Diagnosis not present

## 2015-12-15 DIAGNOSIS — K219 Gastro-esophageal reflux disease without esophagitis: Secondary | ICD-10-CM | POA: Diagnosis not present

## 2015-12-15 DIAGNOSIS — G4700 Insomnia, unspecified: Secondary | ICD-10-CM | POA: Diagnosis not present

## 2015-12-15 DIAGNOSIS — N400 Enlarged prostate without lower urinary tract symptoms: Secondary | ICD-10-CM | POA: Diagnosis not present

## 2015-12-15 DIAGNOSIS — M25569 Pain in unspecified knee: Secondary | ICD-10-CM | POA: Diagnosis not present

## 2015-12-15 DIAGNOSIS — M109 Gout, unspecified: Secondary | ICD-10-CM | POA: Diagnosis not present

## 2015-12-15 DIAGNOSIS — E782 Mixed hyperlipidemia: Secondary | ICD-10-CM | POA: Diagnosis not present

## 2015-12-15 DIAGNOSIS — E1159 Type 2 diabetes mellitus with other circulatory complications: Secondary | ICD-10-CM | POA: Diagnosis not present

## 2015-12-15 DIAGNOSIS — I2510 Atherosclerotic heart disease of native coronary artery without angina pectoris: Secondary | ICD-10-CM | POA: Diagnosis not present

## 2015-12-15 DIAGNOSIS — E039 Hypothyroidism, unspecified: Secondary | ICD-10-CM | POA: Diagnosis not present

## 2015-12-15 DIAGNOSIS — I10 Essential (primary) hypertension: Secondary | ICD-10-CM | POA: Diagnosis not present

## 2015-12-15 DIAGNOSIS — M545 Low back pain: Secondary | ICD-10-CM | POA: Diagnosis not present

## 2016-01-05 DIAGNOSIS — E119 Type 2 diabetes mellitus without complications: Secondary | ICD-10-CM | POA: Diagnosis not present

## 2016-01-05 DIAGNOSIS — E039 Hypothyroidism, unspecified: Secondary | ICD-10-CM | POA: Diagnosis not present

## 2016-01-05 DIAGNOSIS — Z7984 Long term (current) use of oral hypoglycemic drugs: Secondary | ICD-10-CM | POA: Diagnosis not present

## 2016-01-14 DIAGNOSIS — M79675 Pain in left toe(s): Secondary | ICD-10-CM | POA: Diagnosis not present

## 2016-01-14 DIAGNOSIS — M79674 Pain in right toe(s): Secondary | ICD-10-CM | POA: Diagnosis not present

## 2016-01-26 DIAGNOSIS — N3000 Acute cystitis without hematuria: Secondary | ICD-10-CM | POA: Diagnosis not present

## 2016-02-02 DIAGNOSIS — R509 Fever, unspecified: Secondary | ICD-10-CM | POA: Diagnosis not present

## 2016-02-02 DIAGNOSIS — N3000 Acute cystitis without hematuria: Secondary | ICD-10-CM | POA: Diagnosis not present

## 2016-02-22 DIAGNOSIS — B351 Tinea unguium: Secondary | ICD-10-CM | POA: Diagnosis not present

## 2016-02-22 DIAGNOSIS — M79674 Pain in right toe(s): Secondary | ICD-10-CM | POA: Diagnosis not present

## 2016-02-22 DIAGNOSIS — M79675 Pain in left toe(s): Secondary | ICD-10-CM | POA: Diagnosis not present

## 2016-03-21 DIAGNOSIS — M79675 Pain in left toe(s): Secondary | ICD-10-CM | POA: Diagnosis not present

## 2016-03-21 DIAGNOSIS — B351 Tinea unguium: Secondary | ICD-10-CM | POA: Diagnosis not present

## 2016-03-21 DIAGNOSIS — M79674 Pain in right toe(s): Secondary | ICD-10-CM | POA: Diagnosis not present

## 2016-04-12 DIAGNOSIS — E038 Other specified hypothyroidism: Secondary | ICD-10-CM | POA: Diagnosis not present

## 2016-04-12 DIAGNOSIS — Z7984 Long term (current) use of oral hypoglycemic drugs: Secondary | ICD-10-CM | POA: Diagnosis not present

## 2016-04-12 DIAGNOSIS — E119 Type 2 diabetes mellitus without complications: Secondary | ICD-10-CM | POA: Diagnosis not present

## 2016-05-02 DIAGNOSIS — R300 Dysuria: Secondary | ICD-10-CM | POA: Diagnosis not present

## 2016-06-14 DIAGNOSIS — I10 Essential (primary) hypertension: Secondary | ICD-10-CM | POA: Diagnosis not present

## 2016-06-14 DIAGNOSIS — M25569 Pain in unspecified knee: Secondary | ICD-10-CM | POA: Diagnosis not present

## 2016-06-14 DIAGNOSIS — E559 Vitamin D deficiency, unspecified: Secondary | ICD-10-CM | POA: Diagnosis not present

## 2016-06-14 DIAGNOSIS — Z0000 Encounter for general adult medical examination without abnormal findings: Secondary | ICD-10-CM | POA: Diagnosis not present

## 2016-06-14 DIAGNOSIS — Z1389 Encounter for screening for other disorder: Secondary | ICD-10-CM | POA: Diagnosis not present

## 2016-06-14 DIAGNOSIS — E782 Mixed hyperlipidemia: Secondary | ICD-10-CM | POA: Diagnosis not present

## 2016-06-14 DIAGNOSIS — M109 Gout, unspecified: Secondary | ICD-10-CM | POA: Diagnosis not present

## 2016-06-14 DIAGNOSIS — I2510 Atherosclerotic heart disease of native coronary artery without angina pectoris: Secondary | ICD-10-CM | POA: Diagnosis not present

## 2016-06-14 DIAGNOSIS — Z125 Encounter for screening for malignant neoplasm of prostate: Secondary | ICD-10-CM | POA: Diagnosis not present

## 2016-06-14 DIAGNOSIS — N400 Enlarged prostate without lower urinary tract symptoms: Secondary | ICD-10-CM | POA: Diagnosis not present

## 2016-06-14 DIAGNOSIS — G4700 Insomnia, unspecified: Secondary | ICD-10-CM | POA: Diagnosis not present

## 2016-06-14 DIAGNOSIS — M545 Low back pain: Secondary | ICD-10-CM | POA: Diagnosis not present

## 2016-06-20 DIAGNOSIS — G4733 Obstructive sleep apnea (adult) (pediatric): Secondary | ICD-10-CM | POA: Diagnosis not present

## 2016-07-12 DIAGNOSIS — E119 Type 2 diabetes mellitus without complications: Secondary | ICD-10-CM | POA: Diagnosis not present

## 2016-07-12 DIAGNOSIS — E039 Hypothyroidism, unspecified: Secondary | ICD-10-CM | POA: Diagnosis not present

## 2016-08-02 DIAGNOSIS — E119 Type 2 diabetes mellitus without complications: Secondary | ICD-10-CM | POA: Diagnosis not present

## 2016-09-02 DIAGNOSIS — M1712 Unilateral primary osteoarthritis, left knee: Secondary | ICD-10-CM | POA: Diagnosis not present

## 2016-09-02 DIAGNOSIS — Z1389 Encounter for screening for other disorder: Secondary | ICD-10-CM | POA: Diagnosis not present

## 2016-10-06 DIAGNOSIS — M1712 Unilateral primary osteoarthritis, left knee: Secondary | ICD-10-CM | POA: Diagnosis not present

## 2016-10-06 DIAGNOSIS — M170 Bilateral primary osteoarthritis of knee: Secondary | ICD-10-CM | POA: Diagnosis not present

## 2016-10-06 DIAGNOSIS — G8929 Other chronic pain: Secondary | ICD-10-CM | POA: Diagnosis not present

## 2016-10-06 DIAGNOSIS — M25562 Pain in left knee: Secondary | ICD-10-CM | POA: Diagnosis not present

## 2016-10-06 DIAGNOSIS — M25561 Pain in right knee: Secondary | ICD-10-CM | POA: Diagnosis not present

## 2016-10-20 DIAGNOSIS — Z23 Encounter for immunization: Secondary | ICD-10-CM | POA: Diagnosis not present

## 2016-12-06 DIAGNOSIS — J309 Allergic rhinitis, unspecified: Secondary | ICD-10-CM | POA: Diagnosis not present

## 2016-12-06 DIAGNOSIS — E039 Hypothyroidism, unspecified: Secondary | ICD-10-CM | POA: Diagnosis not present

## 2016-12-06 DIAGNOSIS — M25569 Pain in unspecified knee: Secondary | ICD-10-CM | POA: Diagnosis not present

## 2016-12-06 DIAGNOSIS — M109 Gout, unspecified: Secondary | ICD-10-CM | POA: Diagnosis not present

## 2016-12-06 DIAGNOSIS — R300 Dysuria: Secondary | ICD-10-CM | POA: Diagnosis not present

## 2016-12-06 DIAGNOSIS — E559 Vitamin D deficiency, unspecified: Secondary | ICD-10-CM | POA: Diagnosis not present

## 2016-12-06 DIAGNOSIS — E782 Mixed hyperlipidemia: Secondary | ICD-10-CM | POA: Diagnosis not present

## 2016-12-06 DIAGNOSIS — K219 Gastro-esophageal reflux disease without esophagitis: Secondary | ICD-10-CM | POA: Diagnosis not present

## 2016-12-06 DIAGNOSIS — I10 Essential (primary) hypertension: Secondary | ICD-10-CM | POA: Diagnosis not present

## 2016-12-06 DIAGNOSIS — I2510 Atherosclerotic heart disease of native coronary artery without angina pectoris: Secondary | ICD-10-CM | POA: Diagnosis not present

## 2016-12-06 DIAGNOSIS — G4700 Insomnia, unspecified: Secondary | ICD-10-CM | POA: Diagnosis not present

## 2016-12-06 DIAGNOSIS — E1159 Type 2 diabetes mellitus with other circulatory complications: Secondary | ICD-10-CM | POA: Diagnosis not present

## 2017-02-06 DIAGNOSIS — L309 Dermatitis, unspecified: Secondary | ICD-10-CM | POA: Diagnosis not present

## 2017-02-06 DIAGNOSIS — L821 Other seborrheic keratosis: Secondary | ICD-10-CM | POA: Diagnosis not present

## 2017-03-27 DIAGNOSIS — J069 Acute upper respiratory infection, unspecified: Secondary | ICD-10-CM | POA: Diagnosis not present

## 2017-04-04 DIAGNOSIS — E039 Hypothyroidism, unspecified: Secondary | ICD-10-CM | POA: Diagnosis not present

## 2017-04-04 DIAGNOSIS — E119 Type 2 diabetes mellitus without complications: Secondary | ICD-10-CM | POA: Diagnosis not present

## 2017-05-02 ENCOUNTER — Other Ambulatory Visit: Payer: Self-pay | Admitting: Family Medicine

## 2017-05-02 ENCOUNTER — Ambulatory Visit
Admission: RE | Admit: 2017-05-02 | Discharge: 2017-05-02 | Disposition: A | Discharge status: Home / Self Care | Payer: Medicare Other | Source: Ambulatory Visit | Attending: Family Medicine | Admitting: Family Medicine

## 2017-05-02 DIAGNOSIS — R1032 Left lower quadrant pain: Secondary | ICD-10-CM

## 2017-05-02 DIAGNOSIS — K5900 Constipation, unspecified: Secondary | ICD-10-CM | POA: Diagnosis not present

## 2017-05-02 DIAGNOSIS — E119 Type 2 diabetes mellitus without complications: Secondary | ICD-10-CM | POA: Diagnosis not present

## 2017-05-02 DIAGNOSIS — N200 Calculus of kidney: Secondary | ICD-10-CM | POA: Diagnosis not present

## 2017-05-02 DIAGNOSIS — Z7984 Long term (current) use of oral hypoglycemic drugs: Secondary | ICD-10-CM | POA: Diagnosis not present

## 2017-05-02 MED ORDER — IOPAMIDOL (ISOVUE-300) INJECTION 61%
125.0000 mL | Freq: Once | INTRAVENOUS | Status: AC | PRN
  Administered 2017-05-02: 125 mL via INTRAVENOUS

## 2017-05-03 DIAGNOSIS — L821 Other seborrheic keratosis: Secondary | ICD-10-CM | POA: Diagnosis not present

## 2017-05-03 DIAGNOSIS — D225 Melanocytic nevi of trunk: Secondary | ICD-10-CM | POA: Diagnosis not present

## 2017-05-03 DIAGNOSIS — D1800 Hemangioma unspecified site: Secondary | ICD-10-CM | POA: Diagnosis not present

## 2017-05-03 DIAGNOSIS — L814 Other melanin hyperpigmentation: Secondary | ICD-10-CM | POA: Diagnosis not present

## 2017-05-05 DIAGNOSIS — D3502 Benign neoplasm of left adrenal gland: Secondary | ICD-10-CM | POA: Diagnosis not present

## 2017-05-05 DIAGNOSIS — I10 Essential (primary) hypertension: Secondary | ICD-10-CM | POA: Diagnosis not present

## 2017-05-05 DIAGNOSIS — E119 Type 2 diabetes mellitus without complications: Secondary | ICD-10-CM | POA: Diagnosis not present

## 2017-05-05 DIAGNOSIS — E039 Hypothyroidism, unspecified: Secondary | ICD-10-CM | POA: Diagnosis not present

## 2017-05-05 DIAGNOSIS — Z7984 Long term (current) use of oral hypoglycemic drugs: Secondary | ICD-10-CM | POA: Diagnosis not present

## 2017-05-08 DIAGNOSIS — D3502 Benign neoplasm of left adrenal gland: Secondary | ICD-10-CM | POA: Diagnosis not present

## 2017-05-08 DIAGNOSIS — I10 Essential (primary) hypertension: Secondary | ICD-10-CM | POA: Diagnosis not present

## 2017-05-08 DIAGNOSIS — E119 Type 2 diabetes mellitus without complications: Secondary | ICD-10-CM | POA: Diagnosis not present

## 2017-05-09 DIAGNOSIS — D3502 Benign neoplasm of left adrenal gland: Secondary | ICD-10-CM | POA: Diagnosis not present

## 2017-05-09 DIAGNOSIS — E119 Type 2 diabetes mellitus without complications: Secondary | ICD-10-CM | POA: Diagnosis not present

## 2017-05-09 DIAGNOSIS — I10 Essential (primary) hypertension: Secondary | ICD-10-CM | POA: Diagnosis not present

## 2017-06-02 DIAGNOSIS — M170 Bilateral primary osteoarthritis of knee: Secondary | ICD-10-CM | POA: Diagnosis not present

## 2017-06-22 DIAGNOSIS — G4733 Obstructive sleep apnea (adult) (pediatric): Secondary | ICD-10-CM | POA: Diagnosis not present

## 2017-06-22 DIAGNOSIS — E1169 Type 2 diabetes mellitus with other specified complication: Secondary | ICD-10-CM | POA: Diagnosis not present

## 2017-07-28 ENCOUNTER — Encounter: Payer: Self-pay | Admitting: Interventional Cardiology

## 2017-08-07 ENCOUNTER — Encounter: Payer: Self-pay | Admitting: Interventional Cardiology

## 2017-08-07 ENCOUNTER — Ambulatory Visit (INDEPENDENT_AMBULATORY_CARE_PROVIDER_SITE_OTHER): Payer: Medicare Other | Admitting: Interventional Cardiology

## 2017-08-07 VITALS — BP 126/82 | HR 60 | Ht 71.5 in | Wt 285.8 lb

## 2017-08-07 DIAGNOSIS — I452 Bifascicular block: Secondary | ICD-10-CM

## 2017-08-07 DIAGNOSIS — G4733 Obstructive sleep apnea (adult) (pediatric): Secondary | ICD-10-CM | POA: Diagnosis not present

## 2017-08-07 DIAGNOSIS — I10 Essential (primary) hypertension: Secondary | ICD-10-CM | POA: Diagnosis not present

## 2017-08-07 NOTE — Patient Instructions (Signed)
Medication Instructions:  Your physician recommends that you continue on your current medications as directed. Please refer to the Current Medication list given to you today.  Labwork: None  Testing/Procedures: None  Follow-Up: Your physician recommends that you schedule a follow-up appointment as needed with Dr. Smith.     Any Other Special Instructions Will Be Listed Below (If Applicable).     If you need a refill on your cardiac medications before your next appointment, please call your pharmacy.   

## 2017-08-07 NOTE — Progress Notes (Signed)
Cardiology Office Note    Date:  08/07/2017   ID:  Richard Berry, DOB [birth year], MRN [id number]  PCP:  Antony Contras, MD  Cardiologist: Sinclair Grooms, MD   Chief Complaint  Patient presents with  . Congestive Heart Failure    Diastolic dysfunction  . Abnormal ECG    History of Present Illness:  Richard Berry is a 72 y.o. male who presents for morbid obesity, sleep apnea, bifascicular block on EKG, essential hypertension, suspected diastolic heart failure, and additional cardiovascular risk factor diabetes mellitus, type II.    Is doing well.  He denies chest discomfort, palpitations, syncope, orthopnea, PND, edema, and palpitations.  He consistently rides his bicycle several times per week.  Last week he did 70 miles.  No exertional discomfort with activity.  Past Medical History:  Diagnosis Date  . Diabetes mellitus without complication ([place])   . GERD (gastroesophageal reflux disease)   . Hyperlipidemia   . Hypertension   . Obesity   . Sleep apnea   . Sleep apnea   . Thyroid disease     Past Surgical History:  Procedure Laterality Date  . none      Current Medications: Outpatient Medications Prior to Visit  Medication Sig Dispense Refill  . allopurinol (ZYLOPRIM) 100 MG tablet Take 100 mg by mouth daily. For gout    . amLODipine (NORVASC) 5 MG tablet Take 1 tablet (5 mg total) by mouth daily. 30 tablet 11  . atorvastatin (LIPITOR) 40 MG tablet Take 1 tablet by mouth at bedtime.  3  . beta carotene w/minerals (OCUVITE) tablet Take 1 tablet by mouth 2 (two) times daily.    . Cholecalciferol (D-3-5) 5000 UNITS capsule Take 5,000 Units by mouth daily.    . clonazePAM (KLONOPIN) 0.5 MG tablet Take 0.25-0.5 mg by mouth at bedtime as needed for sleep.    . cyanocobalamin 1000 MCG tablet Take 1,000 mcg by mouth daily.    . cyclobenzaprine (FLEXERIL) 10 MG tablet Take 10 mg by mouth 3 (three) times daily as needed for muscle spasms.    . finasteride (PROSCAR) 5 MG  tablet TAKE 1 TABLET BY MOUTH EVERY MORNING  1  . hydrochlorothiazide (HYDRODIURIL) 12.5 MG tablet Take 12.5 mg by mouth daily as needed (FOR FLUID).     Marland Kitchen JARDIANCE 10 MG TABS tablet Take 10 mg by mouth daily.  11  . levothyroxine (SYNTHROID, LEVOTHROID) 50 MCG tablet Take 50 mcg by mouth daily.  1  . loratadine (CLARITIN) 10 MG tablet Take 10 mg by mouth daily as needed for allergies.    . meloxicam (MOBIC) 15 MG tablet Take 7.5 mg by mouth 2 (two) times daily.   0  . metoprolol succinate (TOPROL-XL) 50 MG 24 hr tablet Take 50 mg by mouth daily.  1  . Misc Natural Products (OSTEO BI-FLEX TRIPLE STRENGTH PO) Take 2 tablets by mouth daily.    . NON FORMULARY Use CPAP machine as directed.    . traMADol (ULTRAM) 50 MG tablet TAKE 1 TABLET BY MOUTH EVERY 6 HOURS AS NEEDED FOR PAIN  0  . atenolol (TENORMIN) 50 MG tablet Take 50 mg by mouth daily.  0  . fluconazole (DIFLUCAN) 200 MG tablet TK 1 T PO 1 TIME A WEEK FOR 4 WKS  0  . JANUMET 50-1000 MG per tablet 1 tablet 2 (two) times daily with a meal.   4  . trimethoprim-polymyxin b (POLYTRIM) ophthalmic solution Place 1 drop into the  left eye every 4 (four) hours. For 5 days (Patient not taking: Reported on 08/07/2017) 10 mL 0   No facility-administered medications prior to visit.      Allergies:   Cozaar [losartan potassium]; Flomax [tamsulosin hcl]; Lotensin [benazepril hcl]; Maxzide [triamterene-hctz]; and Ppd [tuberculin purified protein derivative]   Social History   Socioeconomic History  . Marital status: Single    Spouse name: Not on file  . Number of children: Not on file  . Years of education: Not on file  . Highest education level: Not on file  Occupational History  . Not on file  Social Needs  . Financial resource strain: Not on file  . Food insecurity:    Worry: Not on file    Inability: Not on file  . Transportation needs:    Medical: Not on file    Non-medical: Not on file  Tobacco Use  . Smoking status: Never Smoker  .  Smokeless tobacco: Never Used  Substance and Sexual Activity  . Alcohol use: No    Alcohol/week: 0.0 oz  . Drug use: Never  . Sexual activity: Not on file  Lifestyle  . Physical activity:    Days per week: Not on file    Minutes per session: Not on file  . Stress: Not on file  Relationships  . Social connections:    Talks on phone: Not on file    Gets together: Not on file    Attends religious service: Not on file    Active member of club or organization: Not on file    Attends meetings of clubs or organizations: Not on file    Relationship status: Not on file  Other Topics Concern  . Not on file  Social History Narrative  . Not on file     Family History:  The patient's family history includes Blindness in his mother; Diabetes in his other and sister; Healthy in his brother; Heart attack in his father; Heart disease in his father, mother, other, and sister; Hyperlipidemia in his other; Hypertension in his other.   ROS:   Please see the history of present illness.    Cough, hearing loss, excessive sweating, difficulty with balance, some dizziness when he stands.  States he feels lightheaded. All other systems reviewed and are negative.   PHYSICAL EXAM:   VS:  BP 126/82   Pulse 60   Ht 5' 11.5" (1.816 m)   Wt 285 lb 12.8 oz (129.6 kg)   BMI 39.31 kg/m    GEN: Well nourished, well developed, in no acute distress  HEENT: normal  Neck: no JVD, carotid bruits, or masses Cardiac: RRR; no murmurs, rubs, or gallops,no edema  Respiratory:  clear to auscultation bilaterally, normal work of breathing GI: soft, nontender, nondistended, + BS MS: no deformity or atrophy  Skin: warm and dry, no rash Neuro:  Alert and Oriented x 3, Strength and sensation are intact Psych: euthymic mood, full affect  Wt Readings from Last 3 Encounters:  08/07/17 285 lb 12.8 oz (129.6 kg)  01/16/15 (!) 317 lb 6.4 oz (144 kg)  12/18/13 (!) 318 lb (144.2 kg)      Studies/Labs Reviewed:   EKG:   EKG sinus rhythm/bradycardia, mild first-degree AV block measuring 206 ms, right bundle branch block, left anterior hemiblock.  When compared to prior tracing from December 2016, the PR interval is slightly shorter.  Recent Labs: No results found for requested labs within last 8760 hours.   Lipid Panel No  results found for: CHOL, TRIG, HDL, CHOLHDL, VLDL, LDLCALC, LDLDIRECT  Additional studies/ records that were reviewed today include:  2D Doppler echocardiogram 2014:  Study Conclusions  - Left ventricle: The cavity size was normal. Wall thickness was increased in a pattern of moderate LVH. Doppler parameters are consistent with abnormal left ventricular relaxation (grade 1 diastolic dysfunction). Doppler parameters are consistent with high ventricular filling pressure. - Left atrium: The atrium was mildly dilated.  ASSESSMENT:    1. Essential hypertension   2. Bifascicular block   3. Obstructive sleep apnea      PLAN:  In order of problems listed above:  1. Blood pressure is controlled without orthostasis.  BP target 130/80 mmHg. 2. Trifascicular block is present.  No change from 2 years ago.  PR interval is shorter. 3. Encourage CPAP use.  Clinical observation.  Continue active lifestyle.  Call if cardiac symptoms.  Otherwise PRN follow-up  Medication Adjustments/Labs and Tests Ordered: Current medicines are reviewed at length with the patient today.  Concerns regarding medicines are outlined above.  Medication changes, Labs and Tests ordered today are listed in the Patient Instructions below. There are no Patient Instructions on file for this visit.   Signed, Sinclair Grooms, MD  08/07/2017 1:28 PM    [facility] [place], [place], [place]  [address] Phone: [phone]; Fax: [phone]

## 2017-09-05 DIAGNOSIS — Z1389 Encounter for screening for other disorder: Secondary | ICD-10-CM | POA: Diagnosis not present

## 2017-09-05 DIAGNOSIS — Z1159 Encounter for screening for other viral diseases: Secondary | ICD-10-CM | POA: Diagnosis not present

## 2017-09-05 DIAGNOSIS — E119 Type 2 diabetes mellitus without complications: Secondary | ICD-10-CM | POA: Diagnosis not present

## 2017-09-05 DIAGNOSIS — E1159 Type 2 diabetes mellitus with other circulatory complications: Secondary | ICD-10-CM | POA: Diagnosis not present

## 2017-09-05 DIAGNOSIS — E559 Vitamin D deficiency, unspecified: Secondary | ICD-10-CM | POA: Diagnosis not present

## 2017-09-05 DIAGNOSIS — I2510 Atherosclerotic heart disease of native coronary artery without angina pectoris: Secondary | ICD-10-CM | POA: Diagnosis not present

## 2017-09-05 DIAGNOSIS — Z0000 Encounter for general adult medical examination without abnormal findings: Secondary | ICD-10-CM | POA: Diagnosis not present

## 2017-09-05 DIAGNOSIS — E782 Mixed hyperlipidemia: Secondary | ICD-10-CM | POA: Diagnosis not present

## 2017-09-05 DIAGNOSIS — D3502 Benign neoplasm of left adrenal gland: Secondary | ICD-10-CM | POA: Diagnosis not present

## 2017-09-05 DIAGNOSIS — I10 Essential (primary) hypertension: Secondary | ICD-10-CM | POA: Diagnosis not present

## 2017-09-05 DIAGNOSIS — E039 Hypothyroidism, unspecified: Secondary | ICD-10-CM | POA: Diagnosis not present

## 2017-09-05 DIAGNOSIS — M109 Gout, unspecified: Secondary | ICD-10-CM | POA: Diagnosis not present

## 2017-09-05 DIAGNOSIS — K219 Gastro-esophageal reflux disease without esophagitis: Secondary | ICD-10-CM | POA: Diagnosis not present

## 2017-11-16 DIAGNOSIS — Z23 Encounter for immunization: Secondary | ICD-10-CM | POA: Diagnosis not present

## 2017-12-15 DIAGNOSIS — E119 Type 2 diabetes mellitus without complications: Secondary | ICD-10-CM | POA: Diagnosis not present

## 2017-12-16 DIAGNOSIS — K645 Perianal venous thrombosis: Secondary | ICD-10-CM | POA: Diagnosis not present

## 2017-12-22 DIAGNOSIS — K645 Perianal venous thrombosis: Secondary | ICD-10-CM | POA: Diagnosis not present

## 2018-01-16 DIAGNOSIS — M170 Bilateral primary osteoarthritis of knee: Secondary | ICD-10-CM | POA: Diagnosis not present

## 2018-02-08 DIAGNOSIS — K645 Perianal venous thrombosis: Secondary | ICD-10-CM | POA: Diagnosis not present

## 2018-03-22 DIAGNOSIS — I2510 Atherosclerotic heart disease of native coronary artery without angina pectoris: Secondary | ICD-10-CM | POA: Diagnosis not present

## 2018-03-22 DIAGNOSIS — N529 Male erectile dysfunction, unspecified: Secondary | ICD-10-CM | POA: Diagnosis not present

## 2018-03-22 DIAGNOSIS — M109 Gout, unspecified: Secondary | ICD-10-CM | POA: Diagnosis not present

## 2018-03-22 DIAGNOSIS — E119 Type 2 diabetes mellitus without complications: Secondary | ICD-10-CM | POA: Diagnosis not present

## 2018-03-22 DIAGNOSIS — E039 Hypothyroidism, unspecified: Secondary | ICD-10-CM | POA: Diagnosis not present

## 2018-03-22 DIAGNOSIS — G4700 Insomnia, unspecified: Secondary | ICD-10-CM | POA: Diagnosis not present

## 2018-03-22 DIAGNOSIS — M25569 Pain in unspecified knee: Secondary | ICD-10-CM | POA: Diagnosis not present

## 2018-03-22 DIAGNOSIS — I10 Essential (primary) hypertension: Secondary | ICD-10-CM | POA: Diagnosis not present

## 2018-03-22 DIAGNOSIS — D3502 Benign neoplasm of left adrenal gland: Secondary | ICD-10-CM | POA: Diagnosis not present

## 2018-03-22 DIAGNOSIS — E1159 Type 2 diabetes mellitus with other circulatory complications: Secondary | ICD-10-CM | POA: Diagnosis not present

## 2018-03-22 DIAGNOSIS — E782 Mixed hyperlipidemia: Secondary | ICD-10-CM | POA: Diagnosis not present

## 2018-03-22 DIAGNOSIS — N400 Enlarged prostate without lower urinary tract symptoms: Secondary | ICD-10-CM | POA: Diagnosis not present

## 2018-03-22 DIAGNOSIS — G4730 Sleep apnea, unspecified: Secondary | ICD-10-CM | POA: Diagnosis not present

## 2018-03-22 DIAGNOSIS — E559 Vitamin D deficiency, unspecified: Secondary | ICD-10-CM | POA: Diagnosis not present

## 2018-05-29 ENCOUNTER — Other Ambulatory Visit: Payer: Self-pay | Admitting: Family Medicine

## 2018-05-29 DIAGNOSIS — R911 Solitary pulmonary nodule: Secondary | ICD-10-CM

## 2018-05-30 ENCOUNTER — Other Ambulatory Visit: Payer: Self-pay

## 2018-05-30 ENCOUNTER — Ambulatory Visit
Admission: RE | Admit: 2018-05-30 | Discharge: 2018-05-30 | Disposition: A | Discharge status: Home / Self Care | Payer: Medicare Other | Source: Ambulatory Visit | Attending: Family Medicine | Admitting: Family Medicine

## 2018-05-30 DIAGNOSIS — R911 Solitary pulmonary nodule: Secondary | ICD-10-CM

## 2018-05-31 DIAGNOSIS — M170 Bilateral primary osteoarthritis of knee: Secondary | ICD-10-CM | POA: Diagnosis not present

## 2018-05-31 DIAGNOSIS — M25461 Effusion, right knee: Secondary | ICD-10-CM | POA: Diagnosis not present

## 2018-07-10 DIAGNOSIS — Y33XXXA Other specified events, undetermined intent, initial encounter: Secondary | ICD-10-CM | POA: Diagnosis not present

## 2018-07-10 DIAGNOSIS — M170 Bilateral primary osteoarthritis of knee: Secondary | ICD-10-CM | POA: Diagnosis not present

## 2018-07-10 DIAGNOSIS — S81011A Laceration without foreign body, right knee, initial encounter: Secondary | ICD-10-CM | POA: Diagnosis not present

## 2018-07-10 DIAGNOSIS — S52612A Displaced fracture of left ulna styloid process, initial encounter for closed fracture: Secondary | ICD-10-CM | POA: Diagnosis not present

## 2018-07-10 DIAGNOSIS — S63502A Unspecified sprain of left wrist, initial encounter: Secondary | ICD-10-CM | POA: Diagnosis not present

## 2018-07-10 DIAGNOSIS — S4352XA Sprain of left acromioclavicular joint, initial encounter: Secondary | ICD-10-CM | POA: Diagnosis not present

## 2018-07-10 DIAGNOSIS — M19012 Primary osteoarthritis, left shoulder: Secondary | ICD-10-CM | POA: Diagnosis not present

## 2018-07-10 DIAGNOSIS — M25461 Effusion, right knee: Secondary | ICD-10-CM | POA: Diagnosis not present

## 2018-07-20 DIAGNOSIS — S8001XD Contusion of right knee, subsequent encounter: Secondary | ICD-10-CM | POA: Diagnosis not present

## 2018-07-20 DIAGNOSIS — S4352XA Sprain of left acromioclavicular joint, initial encounter: Secondary | ICD-10-CM | POA: Diagnosis not present

## 2018-07-20 DIAGNOSIS — S63502D Unspecified sprain of left wrist, subsequent encounter: Secondary | ICD-10-CM | POA: Diagnosis not present

## 2018-08-07 DIAGNOSIS — M25571 Pain in right ankle and joints of right foot: Secondary | ICD-10-CM | POA: Diagnosis not present

## 2018-08-07 DIAGNOSIS — M1710 Unilateral primary osteoarthritis, unspecified knee: Secondary | ICD-10-CM | POA: Diagnosis not present

## 2018-08-07 DIAGNOSIS — S43102D Unspecified dislocation of left acromioclavicular joint, subsequent encounter: Secondary | ICD-10-CM | POA: Diagnosis not present

## 2018-08-07 DIAGNOSIS — S81011D Laceration without foreign body, right knee, subsequent encounter: Secondary | ICD-10-CM | POA: Diagnosis not present

## 2018-08-07 DIAGNOSIS — S93401D Sprain of unspecified ligament of right ankle, subsequent encounter: Secondary | ICD-10-CM | POA: Diagnosis not present

## 2018-08-07 DIAGNOSIS — M79671 Pain in right foot: Secondary | ICD-10-CM | POA: Diagnosis not present

## 2018-08-10 DIAGNOSIS — M25512 Pain in left shoulder: Secondary | ICD-10-CM | POA: Diagnosis not present

## 2018-08-10 DIAGNOSIS — S81011D Laceration without foreign body, right knee, subsequent encounter: Secondary | ICD-10-CM | POA: Diagnosis not present

## 2018-08-10 DIAGNOSIS — S93401A Sprain of unspecified ligament of right ankle, initial encounter: Secondary | ICD-10-CM | POA: Diagnosis not present

## 2018-08-10 DIAGNOSIS — M25571 Pain in right ankle and joints of right foot: Secondary | ICD-10-CM | POA: Diagnosis not present

## 2018-08-10 DIAGNOSIS — R2689 Other abnormalities of gait and mobility: Secondary | ICD-10-CM | POA: Diagnosis not present

## 2018-08-10 DIAGNOSIS — M1710 Unilateral primary osteoarthritis, unspecified knee: Secondary | ICD-10-CM | POA: Diagnosis not present

## 2018-08-10 DIAGNOSIS — R531 Weakness: Secondary | ICD-10-CM | POA: Diagnosis not present

## 2018-08-10 DIAGNOSIS — S43102D Unspecified dislocation of left acromioclavicular joint, subsequent encounter: Secondary | ICD-10-CM | POA: Diagnosis not present

## 2018-08-14 DIAGNOSIS — S43102D Unspecified dislocation of left acromioclavicular joint, subsequent encounter: Secondary | ICD-10-CM | POA: Diagnosis not present

## 2018-08-14 DIAGNOSIS — R531 Weakness: Secondary | ICD-10-CM | POA: Diagnosis not present

## 2018-08-14 DIAGNOSIS — R2689 Other abnormalities of gait and mobility: Secondary | ICD-10-CM | POA: Diagnosis not present

## 2018-08-14 DIAGNOSIS — S81011D Laceration without foreign body, right knee, subsequent encounter: Secondary | ICD-10-CM | POA: Diagnosis not present

## 2018-08-14 DIAGNOSIS — M1710 Unilateral primary osteoarthritis, unspecified knee: Secondary | ICD-10-CM | POA: Diagnosis not present

## 2018-08-14 DIAGNOSIS — S93401A Sprain of unspecified ligament of right ankle, initial encounter: Secondary | ICD-10-CM | POA: Diagnosis not present

## 2018-08-16 DIAGNOSIS — R531 Weakness: Secondary | ICD-10-CM | POA: Diagnosis not present

## 2018-08-16 DIAGNOSIS — M1710 Unilateral primary osteoarthritis, unspecified knee: Secondary | ICD-10-CM | POA: Diagnosis not present

## 2018-08-16 DIAGNOSIS — S93401A Sprain of unspecified ligament of right ankle, initial encounter: Secondary | ICD-10-CM | POA: Diagnosis not present

## 2018-08-16 DIAGNOSIS — R2689 Other abnormalities of gait and mobility: Secondary | ICD-10-CM | POA: Diagnosis not present

## 2018-08-16 DIAGNOSIS — S81011D Laceration without foreign body, right knee, subsequent encounter: Secondary | ICD-10-CM | POA: Diagnosis not present

## 2018-08-16 DIAGNOSIS — S43102D Unspecified dislocation of left acromioclavicular joint, subsequent encounter: Secondary | ICD-10-CM | POA: Diagnosis not present

## 2018-08-20 DIAGNOSIS — M1710 Unilateral primary osteoarthritis, unspecified knee: Secondary | ICD-10-CM | POA: Diagnosis not present

## 2018-08-20 DIAGNOSIS — S43102D Unspecified dislocation of left acromioclavicular joint, subsequent encounter: Secondary | ICD-10-CM | POA: Diagnosis not present

## 2018-08-20 DIAGNOSIS — R531 Weakness: Secondary | ICD-10-CM | POA: Diagnosis not present

## 2018-08-20 DIAGNOSIS — S93401A Sprain of unspecified ligament of right ankle, initial encounter: Secondary | ICD-10-CM | POA: Diagnosis not present

## 2018-08-20 DIAGNOSIS — R2689 Other abnormalities of gait and mobility: Secondary | ICD-10-CM | POA: Diagnosis not present

## 2018-08-20 DIAGNOSIS — S81011D Laceration without foreign body, right knee, subsequent encounter: Secondary | ICD-10-CM | POA: Diagnosis not present

## 2018-08-21 DIAGNOSIS — S93401A Sprain of unspecified ligament of right ankle, initial encounter: Secondary | ICD-10-CM | POA: Diagnosis not present

## 2018-08-21 DIAGNOSIS — R2689 Other abnormalities of gait and mobility: Secondary | ICD-10-CM | POA: Diagnosis not present

## 2018-08-21 DIAGNOSIS — R531 Weakness: Secondary | ICD-10-CM | POA: Diagnosis not present

## 2018-08-21 DIAGNOSIS — S81011D Laceration without foreign body, right knee, subsequent encounter: Secondary | ICD-10-CM | POA: Diagnosis not present

## 2018-08-21 DIAGNOSIS — M1710 Unilateral primary osteoarthritis, unspecified knee: Secondary | ICD-10-CM | POA: Diagnosis not present

## 2018-08-21 DIAGNOSIS — S43102D Unspecified dislocation of left acromioclavicular joint, subsequent encounter: Secondary | ICD-10-CM | POA: Diagnosis not present

## 2018-08-23 DIAGNOSIS — R531 Weakness: Secondary | ICD-10-CM | POA: Diagnosis not present

## 2018-08-23 DIAGNOSIS — R2689 Other abnormalities of gait and mobility: Secondary | ICD-10-CM | POA: Diagnosis not present

## 2018-08-23 DIAGNOSIS — S81011D Laceration without foreign body, right knee, subsequent encounter: Secondary | ICD-10-CM | POA: Diagnosis not present

## 2018-08-23 DIAGNOSIS — S93401A Sprain of unspecified ligament of right ankle, initial encounter: Secondary | ICD-10-CM | POA: Diagnosis not present

## 2018-08-23 DIAGNOSIS — S43102D Unspecified dislocation of left acromioclavicular joint, subsequent encounter: Secondary | ICD-10-CM | POA: Diagnosis not present

## 2018-08-23 DIAGNOSIS — M1710 Unilateral primary osteoarthritis, unspecified knee: Secondary | ICD-10-CM | POA: Diagnosis not present

## 2018-08-27 DIAGNOSIS — S43102D Unspecified dislocation of left acromioclavicular joint, subsequent encounter: Secondary | ICD-10-CM | POA: Diagnosis not present

## 2018-08-27 DIAGNOSIS — R531 Weakness: Secondary | ICD-10-CM | POA: Diagnosis not present

## 2018-08-27 DIAGNOSIS — S93401A Sprain of unspecified ligament of right ankle, initial encounter: Secondary | ICD-10-CM | POA: Diagnosis not present

## 2018-08-27 DIAGNOSIS — S81011D Laceration without foreign body, right knee, subsequent encounter: Secondary | ICD-10-CM | POA: Diagnosis not present

## 2018-08-27 DIAGNOSIS — R2689 Other abnormalities of gait and mobility: Secondary | ICD-10-CM | POA: Diagnosis not present

## 2018-08-27 DIAGNOSIS — M1710 Unilateral primary osteoarthritis, unspecified knee: Secondary | ICD-10-CM | POA: Diagnosis not present

## 2018-08-28 DIAGNOSIS — S81011D Laceration without foreign body, right knee, subsequent encounter: Secondary | ICD-10-CM | POA: Diagnosis not present

## 2018-08-28 DIAGNOSIS — S93401A Sprain of unspecified ligament of right ankle, initial encounter: Secondary | ICD-10-CM | POA: Diagnosis not present

## 2018-08-28 DIAGNOSIS — R2689 Other abnormalities of gait and mobility: Secondary | ICD-10-CM | POA: Diagnosis not present

## 2018-08-28 DIAGNOSIS — M1710 Unilateral primary osteoarthritis, unspecified knee: Secondary | ICD-10-CM | POA: Diagnosis not present

## 2018-08-28 DIAGNOSIS — R531 Weakness: Secondary | ICD-10-CM | POA: Diagnosis not present

## 2018-08-28 DIAGNOSIS — S43102D Unspecified dislocation of left acromioclavicular joint, subsequent encounter: Secondary | ICD-10-CM | POA: Diagnosis not present

## 2018-08-30 DIAGNOSIS — M1710 Unilateral primary osteoarthritis, unspecified knee: Secondary | ICD-10-CM | POA: Diagnosis not present

## 2018-08-30 DIAGNOSIS — E559 Vitamin D deficiency, unspecified: Secondary | ICD-10-CM | POA: Diagnosis not present

## 2018-08-30 DIAGNOSIS — M109 Gout, unspecified: Secondary | ICD-10-CM | POA: Diagnosis not present

## 2018-08-30 DIAGNOSIS — E782 Mixed hyperlipidemia: Secondary | ICD-10-CM | POA: Diagnosis not present

## 2018-08-30 DIAGNOSIS — R531 Weakness: Secondary | ICD-10-CM | POA: Diagnosis not present

## 2018-08-30 DIAGNOSIS — E039 Hypothyroidism, unspecified: Secondary | ICD-10-CM | POA: Diagnosis not present

## 2018-08-30 DIAGNOSIS — I10 Essential (primary) hypertension: Secondary | ICD-10-CM | POA: Diagnosis not present

## 2018-08-30 DIAGNOSIS — S43102D Unspecified dislocation of left acromioclavicular joint, subsequent encounter: Secondary | ICD-10-CM | POA: Diagnosis not present

## 2018-08-30 DIAGNOSIS — K219 Gastro-esophageal reflux disease without esophagitis: Secondary | ICD-10-CM | POA: Diagnosis not present

## 2018-08-30 DIAGNOSIS — N529 Male erectile dysfunction, unspecified: Secondary | ICD-10-CM | POA: Diagnosis not present

## 2018-08-30 DIAGNOSIS — E1159 Type 2 diabetes mellitus with other circulatory complications: Secondary | ICD-10-CM | POA: Diagnosis not present

## 2018-08-30 DIAGNOSIS — S81011D Laceration without foreign body, right knee, subsequent encounter: Secondary | ICD-10-CM | POA: Diagnosis not present

## 2018-08-30 DIAGNOSIS — I2510 Atherosclerotic heart disease of native coronary artery without angina pectoris: Secondary | ICD-10-CM | POA: Diagnosis not present

## 2018-08-30 DIAGNOSIS — M25569 Pain in unspecified knee: Secondary | ICD-10-CM | POA: Diagnosis not present

## 2018-08-30 DIAGNOSIS — G4730 Sleep apnea, unspecified: Secondary | ICD-10-CM | POA: Diagnosis not present

## 2018-08-30 DIAGNOSIS — S93401A Sprain of unspecified ligament of right ankle, initial encounter: Secondary | ICD-10-CM | POA: Diagnosis not present

## 2018-08-30 DIAGNOSIS — G4700 Insomnia, unspecified: Secondary | ICD-10-CM | POA: Diagnosis not present

## 2018-08-30 DIAGNOSIS — R2689 Other abnormalities of gait and mobility: Secondary | ICD-10-CM | POA: Diagnosis not present

## 2018-08-31 DIAGNOSIS — E119 Type 2 diabetes mellitus without complications: Secondary | ICD-10-CM | POA: Diagnosis not present

## 2018-08-31 DIAGNOSIS — E782 Mixed hyperlipidemia: Secondary | ICD-10-CM | POA: Diagnosis not present

## 2018-08-31 DIAGNOSIS — E039 Hypothyroidism, unspecified: Secondary | ICD-10-CM | POA: Diagnosis not present

## 2018-08-31 DIAGNOSIS — I10 Essential (primary) hypertension: Secondary | ICD-10-CM | POA: Diagnosis not present

## 2018-08-31 DIAGNOSIS — E1165 Type 2 diabetes mellitus with hyperglycemia: Secondary | ICD-10-CM | POA: Diagnosis not present

## 2018-08-31 DIAGNOSIS — E1159 Type 2 diabetes mellitus with other circulatory complications: Secondary | ICD-10-CM | POA: Diagnosis not present

## 2018-08-31 DIAGNOSIS — M109 Gout, unspecified: Secondary | ICD-10-CM | POA: Diagnosis not present

## 2018-09-03 DIAGNOSIS — M1710 Unilateral primary osteoarthritis, unspecified knee: Secondary | ICD-10-CM | POA: Diagnosis not present

## 2018-09-03 DIAGNOSIS — R531 Weakness: Secondary | ICD-10-CM | POA: Diagnosis not present

## 2018-09-03 DIAGNOSIS — S81011D Laceration without foreign body, right knee, subsequent encounter: Secondary | ICD-10-CM | POA: Diagnosis not present

## 2018-09-03 DIAGNOSIS — R2689 Other abnormalities of gait and mobility: Secondary | ICD-10-CM | POA: Diagnosis not present

## 2018-09-03 DIAGNOSIS — S43102D Unspecified dislocation of left acromioclavicular joint, subsequent encounter: Secondary | ICD-10-CM | POA: Diagnosis not present

## 2018-09-03 DIAGNOSIS — M25512 Pain in left shoulder: Secondary | ICD-10-CM | POA: Diagnosis not present

## 2018-09-03 DIAGNOSIS — M25571 Pain in right ankle and joints of right foot: Secondary | ICD-10-CM | POA: Diagnosis not present

## 2018-09-03 DIAGNOSIS — S93401A Sprain of unspecified ligament of right ankle, initial encounter: Secondary | ICD-10-CM | POA: Diagnosis not present

## 2018-09-03 IMAGING — CT CT ABD-PELV W/ CM
1 of 3 series · 12 of 32 positions shown, 18 images · IV contrast (APPLIED)
Comparison: None.

CLINICAL DATA: Patient with left lower quadrant pain. Evaluate for
diverticulitis.

EXAM:
CT ABDOMEN AND PELVIS WITH CONTRAST
TECHNIQUE: Multidetector CT imaging of the abdomen and pelvis was performed
using the standard protocol following bolus administration of
intravenous contrast.
Creatinine was obtained on site at [HOSPITAL] at [HOSPITAL].
Results: Creatinine 1.3 mg/dL.
CONTRAST:  125mL 8WQIQK-8ZZ IOPAMIDOL (8WQIQK-8ZZ) INJECTION 61%

[Series 2: abd/pelvis w/cm · axial · 0.98mm/px · z∈[-624,-144]mm · 12 of 112 slices shown, 18 images]
[im 8/112  soft-tissue]
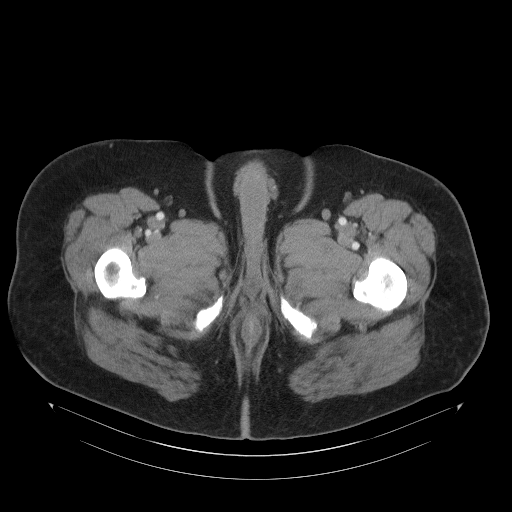
[im 8/112  bone]
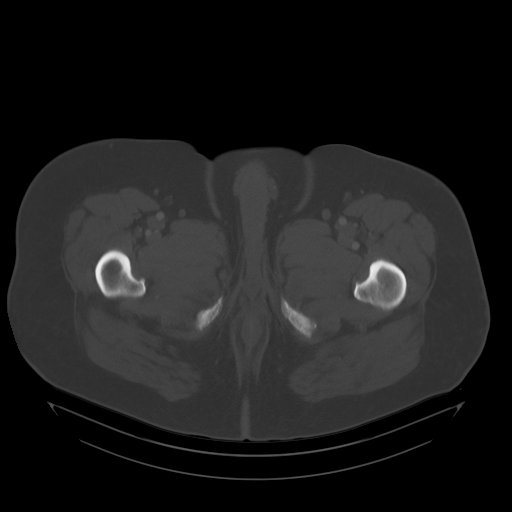
[im 16/112  soft-tissue]
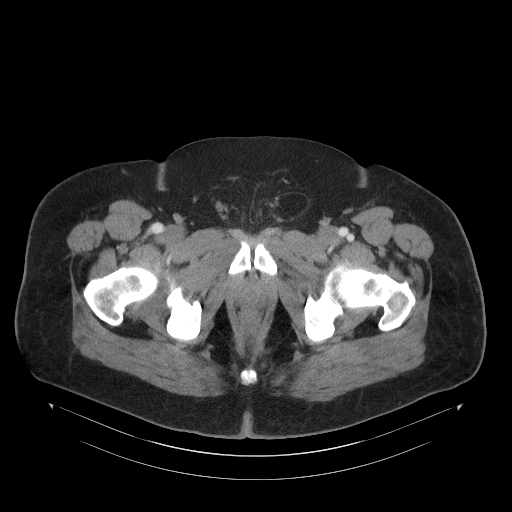
[im 24/112  soft-tissue]
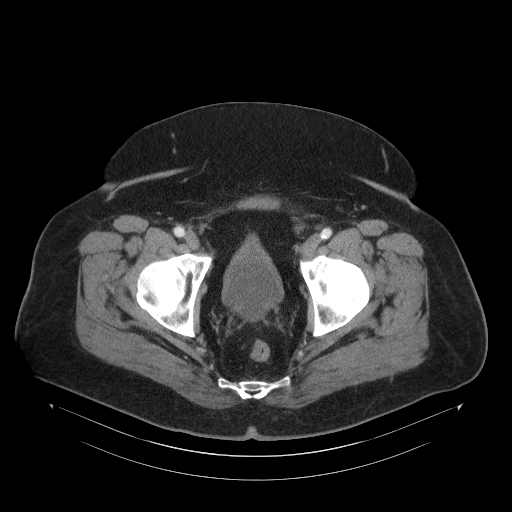
[im 32/112  soft-tissue]
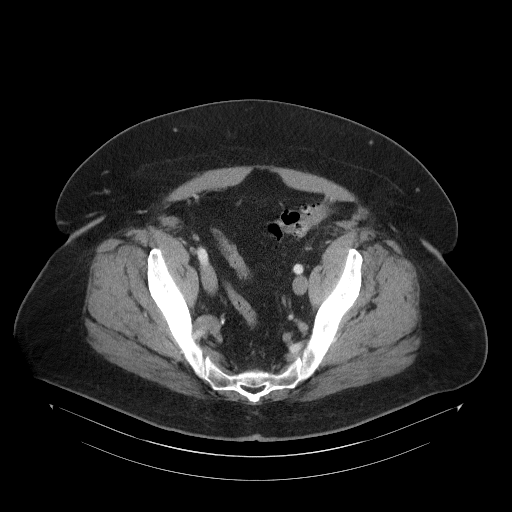
[im 40/112  soft-tissue]
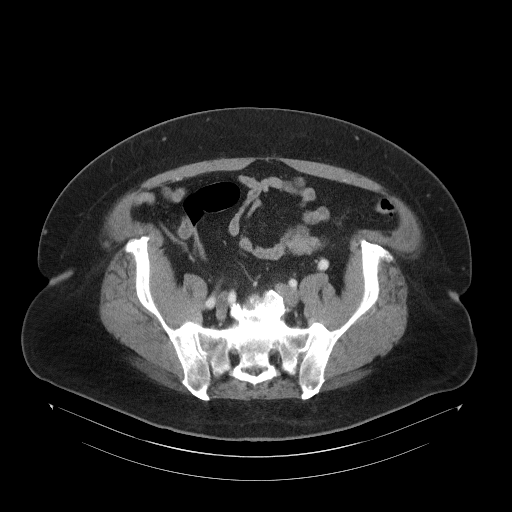
[im 48/112  soft-tissue]
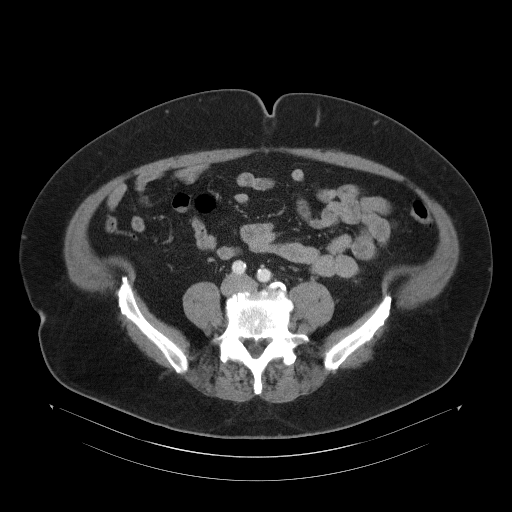
[im 64/112  soft-tissue]
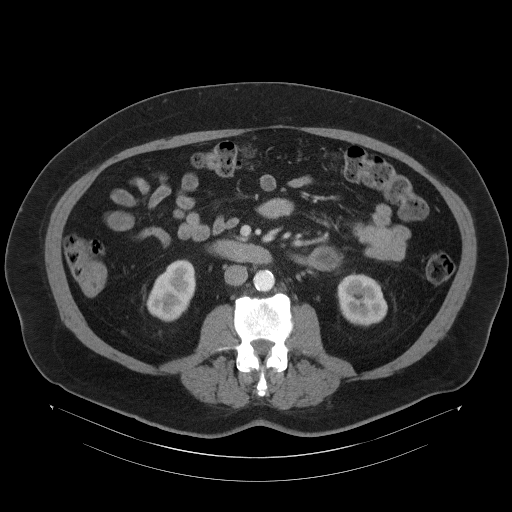
[im 72/112  soft-tissue]
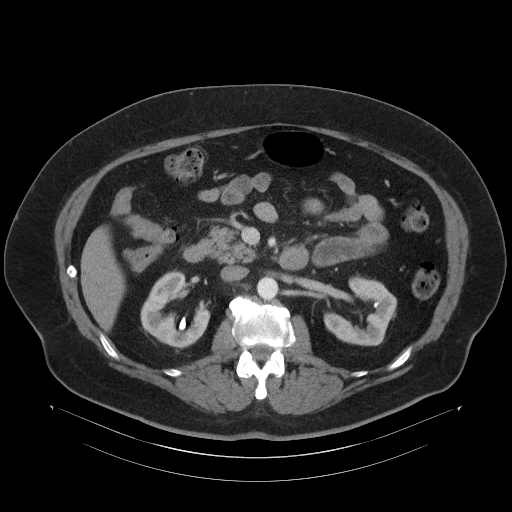
[im 80/112  soft-tissue]
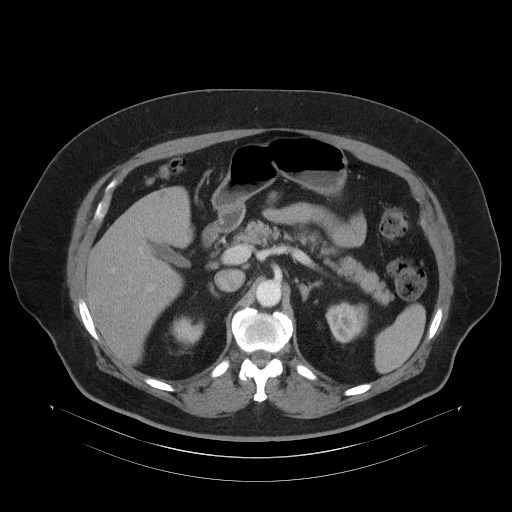
[im 80/112  lung]
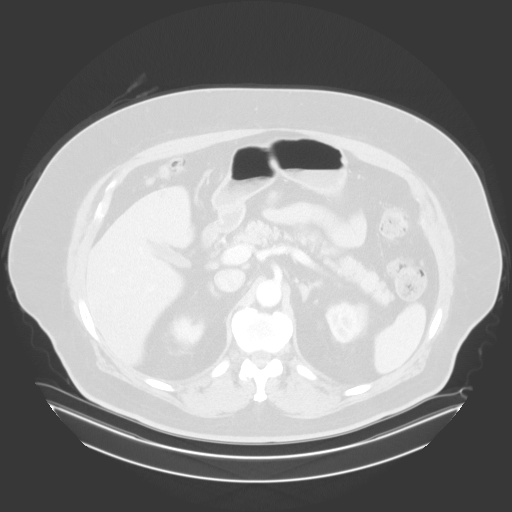
[im 80/112  bone]
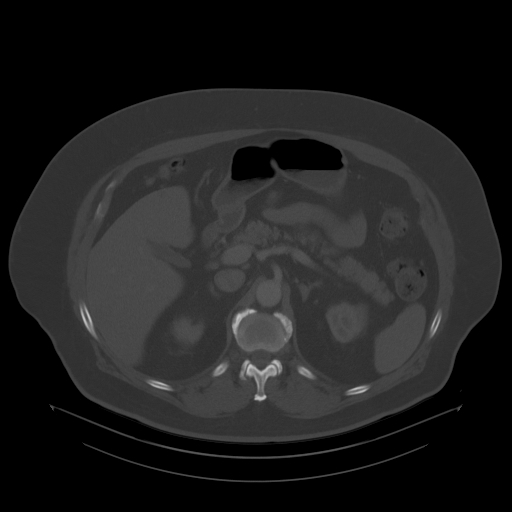
[im 88/112  soft-tissue]
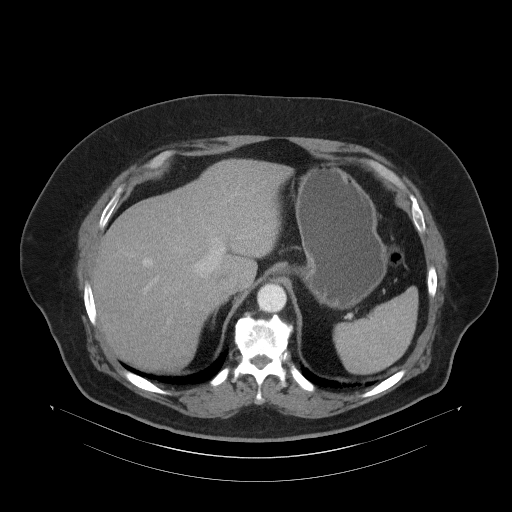
[im 88/112  lung]
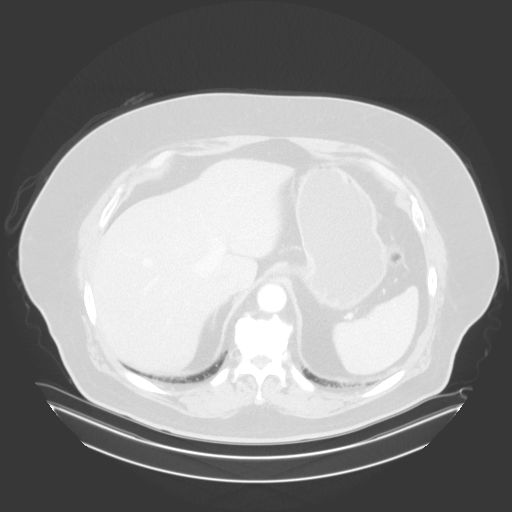
[im 96/112  soft-tissue]
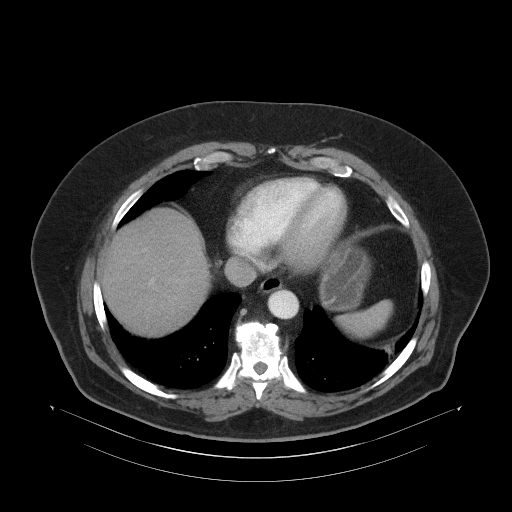
[im 96/112  lung]
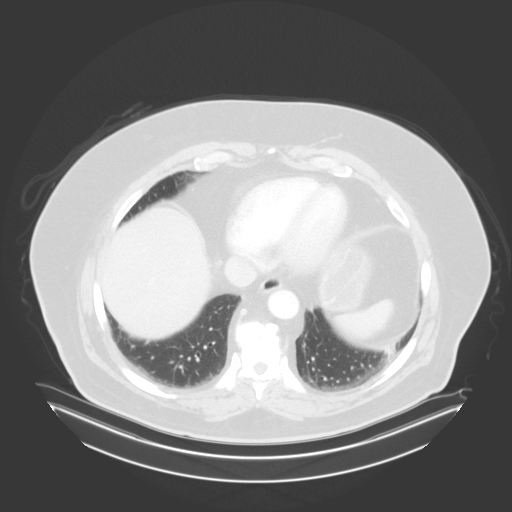
[im 104/112  soft-tissue]
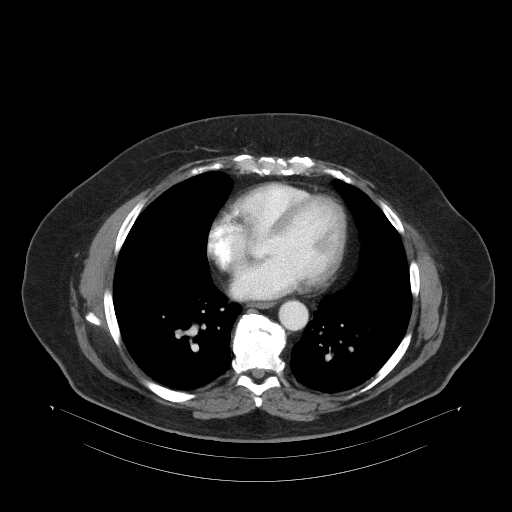
[im 104/112  lung]
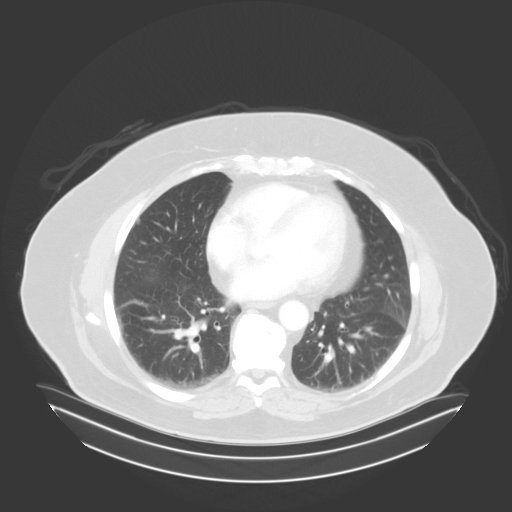

[12 of 32 positions shown; findings below may reference images not displayed]

FINDINGS: Lower chest: Normal heart size. No pericardial effusion. Dependent
atelectasis within the bilateral lower lobes. 6 mm subpleural right
middle lobe nodule (image 20; series 5).

Hepatobiliary: Liver is normal in size and contour. Subcentimeter
too small to characterize low-attenuation lesion medial left hepatic
lobe (image 34; series 2). Gallbladder is unremarkable. No
intrahepatic or extrahepatic biliary ductal dilatation.

Pancreas: Unremarkable

Spleen: Calcified granulomata in the spleen.

Adrenals/Urinary Tract: Indeterminate 1.4 cm left adrenal nodule
(image 34; series 2). Right adrenal gland is normal. Kidneys enhance
symmetrically with contrast. No hydronephrosis. Mild urinary bladder
wall thickening. Bilateral nonobstructing renal stones are
demonstrated measuring 1.4 cm within the interpolar region of the
right kidney and 0.8 cm inferior pole left kidney.

Stomach/Bowel: Sigmoid colonic diverticulosis. No CT evidence for
acute diverticulitis. Normal appendix. No evidence for small bowel
obstruction. No free fluid or free intraperitoneal air. Normal
morphology of the stomach. There is a loop of small bowel within the
left hemiabdomen (image 49; series 2) with mild peripheral
surrounding fat stranding.

Vascular/Lymphatic: Normal caliber abdominal aorta. Peripheral
calcified atherosclerotic plaque. No retroperitoneal
lymphadenopathy.

Reproductive: Prostate unremarkable.

Other: Small fat containing left inguinal hernia.

Musculoskeletal: No aggressive or acute appearing osseous lesions.
Lumbar spine degenerative changes.
IMPRESSION: 1. There is mild wall thickening and surrounding fat stranding about
a loop of small bowel within the left hemiabdomen favored to
represent enteritis.
2. Bilateral nonobstructing nephrolithiasis.
3. Sigmoid colonic diverticulosis without evidence for acute
diverticulitis.
4. Fat containing left inguinal hernia.
5. Indeterminate probably benign left adrenal nodule. Consider 12
month follow-up adrenal protocol CT.
6. There is a 6 mm subpleural right middle lobe pulmonary nodule.
Non-contrast chest CT at 6-12 months is recommended. If the nodule
is stable at time of repeat CT, then future CT at 18-24 months (from
today's scan) is considered optional for low-risk patients, but is
recommended for high-risk patients. This recommendation follows the
consensus statement: Guidelines for Management of Incidental
Pulmonary Nodules Detected on CT Images: From the [HOSPITAL]
7. These results will be called to the ordering clinician or
representative by the Radiologist Assistant, and communication
documented in the PACS or zVision Dashboard.

## 2018-09-04 DIAGNOSIS — R2689 Other abnormalities of gait and mobility: Secondary | ICD-10-CM | POA: Diagnosis not present

## 2018-09-04 DIAGNOSIS — S81011D Laceration without foreign body, right knee, subsequent encounter: Secondary | ICD-10-CM | POA: Diagnosis not present

## 2018-09-04 DIAGNOSIS — S43102D Unspecified dislocation of left acromioclavicular joint, subsequent encounter: Secondary | ICD-10-CM | POA: Diagnosis not present

## 2018-09-04 DIAGNOSIS — S93401A Sprain of unspecified ligament of right ankle, initial encounter: Secondary | ICD-10-CM | POA: Diagnosis not present

## 2018-09-04 DIAGNOSIS — M1710 Unilateral primary osteoarthritis, unspecified knee: Secondary | ICD-10-CM | POA: Diagnosis not present

## 2018-09-04 DIAGNOSIS — R531 Weakness: Secondary | ICD-10-CM | POA: Diagnosis not present

## 2018-09-06 DIAGNOSIS — M1710 Unilateral primary osteoarthritis, unspecified knee: Secondary | ICD-10-CM | POA: Diagnosis not present

## 2018-09-06 DIAGNOSIS — S43102D Unspecified dislocation of left acromioclavicular joint, subsequent encounter: Secondary | ICD-10-CM | POA: Diagnosis not present

## 2018-09-06 DIAGNOSIS — R531 Weakness: Secondary | ICD-10-CM | POA: Diagnosis not present

## 2018-09-06 DIAGNOSIS — S93401A Sprain of unspecified ligament of right ankle, initial encounter: Secondary | ICD-10-CM | POA: Diagnosis not present

## 2018-09-06 DIAGNOSIS — S81011D Laceration without foreign body, right knee, subsequent encounter: Secondary | ICD-10-CM | POA: Diagnosis not present

## 2018-09-06 DIAGNOSIS — R2689 Other abnormalities of gait and mobility: Secondary | ICD-10-CM | POA: Diagnosis not present

## 2018-09-10 DIAGNOSIS — R2689 Other abnormalities of gait and mobility: Secondary | ICD-10-CM | POA: Diagnosis not present

## 2018-09-10 DIAGNOSIS — S43102D Unspecified dislocation of left acromioclavicular joint, subsequent encounter: Secondary | ICD-10-CM | POA: Diagnosis not present

## 2018-09-10 DIAGNOSIS — S93401A Sprain of unspecified ligament of right ankle, initial encounter: Secondary | ICD-10-CM | POA: Diagnosis not present

## 2018-09-10 DIAGNOSIS — M1710 Unilateral primary osteoarthritis, unspecified knee: Secondary | ICD-10-CM | POA: Diagnosis not present

## 2018-09-10 DIAGNOSIS — R531 Weakness: Secondary | ICD-10-CM | POA: Diagnosis not present

## 2018-09-10 DIAGNOSIS — S81011D Laceration without foreign body, right knee, subsequent encounter: Secondary | ICD-10-CM | POA: Diagnosis not present

## 2018-09-11 DIAGNOSIS — M1710 Unilateral primary osteoarthritis, unspecified knee: Secondary | ICD-10-CM | POA: Diagnosis not present

## 2018-09-11 DIAGNOSIS — S43102D Unspecified dislocation of left acromioclavicular joint, subsequent encounter: Secondary | ICD-10-CM | POA: Diagnosis not present

## 2018-09-11 DIAGNOSIS — S93401A Sprain of unspecified ligament of right ankle, initial encounter: Secondary | ICD-10-CM | POA: Diagnosis not present

## 2018-09-11 DIAGNOSIS — S81011D Laceration without foreign body, right knee, subsequent encounter: Secondary | ICD-10-CM | POA: Diagnosis not present

## 2018-09-11 DIAGNOSIS — R531 Weakness: Secondary | ICD-10-CM | POA: Diagnosis not present

## 2018-09-11 DIAGNOSIS — R2689 Other abnormalities of gait and mobility: Secondary | ICD-10-CM | POA: Diagnosis not present

## 2018-09-13 DIAGNOSIS — I10 Essential (primary) hypertension: Secondary | ICD-10-CM | POA: Diagnosis not present

## 2018-09-13 DIAGNOSIS — R2689 Other abnormalities of gait and mobility: Secondary | ICD-10-CM | POA: Diagnosis not present

## 2018-09-13 DIAGNOSIS — S81011D Laceration without foreign body, right knee, subsequent encounter: Secondary | ICD-10-CM | POA: Diagnosis not present

## 2018-09-13 DIAGNOSIS — S93401A Sprain of unspecified ligament of right ankle, initial encounter: Secondary | ICD-10-CM | POA: Diagnosis not present

## 2018-09-13 DIAGNOSIS — S43102D Unspecified dislocation of left acromioclavicular joint, subsequent encounter: Secondary | ICD-10-CM | POA: Diagnosis not present

## 2018-09-13 DIAGNOSIS — D3502 Benign neoplasm of left adrenal gland: Secondary | ICD-10-CM | POA: Diagnosis not present

## 2018-09-13 DIAGNOSIS — M1710 Unilateral primary osteoarthritis, unspecified knee: Secondary | ICD-10-CM | POA: Diagnosis not present

## 2018-09-13 DIAGNOSIS — R531 Weakness: Secondary | ICD-10-CM | POA: Diagnosis not present

## 2018-09-13 DIAGNOSIS — E119 Type 2 diabetes mellitus without complications: Secondary | ICD-10-CM | POA: Diagnosis not present

## 2018-09-13 DIAGNOSIS — E039 Hypothyroidism, unspecified: Secondary | ICD-10-CM | POA: Diagnosis not present

## 2018-09-14 DIAGNOSIS — E119 Type 2 diabetes mellitus without complications: Secondary | ICD-10-CM | POA: Diagnosis not present

## 2018-09-14 DIAGNOSIS — H25813 Combined forms of age-related cataract, bilateral: Secondary | ICD-10-CM | POA: Diagnosis not present

## 2018-09-17 DIAGNOSIS — S43102D Unspecified dislocation of left acromioclavicular joint, subsequent encounter: Secondary | ICD-10-CM | POA: Diagnosis not present

## 2018-09-17 DIAGNOSIS — S93401A Sprain of unspecified ligament of right ankle, initial encounter: Secondary | ICD-10-CM | POA: Diagnosis not present

## 2018-09-17 DIAGNOSIS — S81011D Laceration without foreign body, right knee, subsequent encounter: Secondary | ICD-10-CM | POA: Diagnosis not present

## 2018-09-17 DIAGNOSIS — R531 Weakness: Secondary | ICD-10-CM | POA: Diagnosis not present

## 2018-09-17 DIAGNOSIS — R2689 Other abnormalities of gait and mobility: Secondary | ICD-10-CM | POA: Diagnosis not present

## 2018-09-17 DIAGNOSIS — M1710 Unilateral primary osteoarthritis, unspecified knee: Secondary | ICD-10-CM | POA: Diagnosis not present

## 2018-09-18 DIAGNOSIS — S81011D Laceration without foreign body, right knee, subsequent encounter: Secondary | ICD-10-CM | POA: Diagnosis not present

## 2018-09-18 DIAGNOSIS — R2689 Other abnormalities of gait and mobility: Secondary | ICD-10-CM | POA: Diagnosis not present

## 2018-09-18 DIAGNOSIS — S93401A Sprain of unspecified ligament of right ankle, initial encounter: Secondary | ICD-10-CM | POA: Diagnosis not present

## 2018-09-18 DIAGNOSIS — S43102D Unspecified dislocation of left acromioclavicular joint, subsequent encounter: Secondary | ICD-10-CM | POA: Diagnosis not present

## 2018-09-18 DIAGNOSIS — R531 Weakness: Secondary | ICD-10-CM | POA: Diagnosis not present

## 2018-09-18 DIAGNOSIS — M1710 Unilateral primary osteoarthritis, unspecified knee: Secondary | ICD-10-CM | POA: Diagnosis not present

## 2018-09-20 DIAGNOSIS — S81011D Laceration without foreign body, right knee, subsequent encounter: Secondary | ICD-10-CM | POA: Diagnosis not present

## 2018-09-20 DIAGNOSIS — S93401A Sprain of unspecified ligament of right ankle, initial encounter: Secondary | ICD-10-CM | POA: Diagnosis not present

## 2018-09-20 DIAGNOSIS — R2689 Other abnormalities of gait and mobility: Secondary | ICD-10-CM | POA: Diagnosis not present

## 2018-09-20 DIAGNOSIS — R531 Weakness: Secondary | ICD-10-CM | POA: Diagnosis not present

## 2018-09-20 DIAGNOSIS — M1710 Unilateral primary osteoarthritis, unspecified knee: Secondary | ICD-10-CM | POA: Diagnosis not present

## 2018-09-20 DIAGNOSIS — S43102D Unspecified dislocation of left acromioclavicular joint, subsequent encounter: Secondary | ICD-10-CM | POA: Diagnosis not present

## 2018-09-24 DIAGNOSIS — R531 Weakness: Secondary | ICD-10-CM | POA: Diagnosis not present

## 2018-09-24 DIAGNOSIS — M1710 Unilateral primary osteoarthritis, unspecified knee: Secondary | ICD-10-CM | POA: Diagnosis not present

## 2018-09-24 DIAGNOSIS — S81011D Laceration without foreign body, right knee, subsequent encounter: Secondary | ICD-10-CM | POA: Diagnosis not present

## 2018-09-24 DIAGNOSIS — S43102D Unspecified dislocation of left acromioclavicular joint, subsequent encounter: Secondary | ICD-10-CM | POA: Diagnosis not present

## 2018-09-24 DIAGNOSIS — S93401A Sprain of unspecified ligament of right ankle, initial encounter: Secondary | ICD-10-CM | POA: Diagnosis not present

## 2018-09-24 DIAGNOSIS — R2689 Other abnormalities of gait and mobility: Secondary | ICD-10-CM | POA: Diagnosis not present

## 2018-09-25 DIAGNOSIS — S93401A Sprain of unspecified ligament of right ankle, initial encounter: Secondary | ICD-10-CM | POA: Diagnosis not present

## 2018-09-25 DIAGNOSIS — S43102D Unspecified dislocation of left acromioclavicular joint, subsequent encounter: Secondary | ICD-10-CM | POA: Diagnosis not present

## 2018-09-25 DIAGNOSIS — M1710 Unilateral primary osteoarthritis, unspecified knee: Secondary | ICD-10-CM | POA: Diagnosis not present

## 2018-09-25 DIAGNOSIS — R2689 Other abnormalities of gait and mobility: Secondary | ICD-10-CM | POA: Diagnosis not present

## 2018-09-25 DIAGNOSIS — S81011D Laceration without foreign body, right knee, subsequent encounter: Secondary | ICD-10-CM | POA: Diagnosis not present

## 2018-09-25 DIAGNOSIS — R531 Weakness: Secondary | ICD-10-CM | POA: Diagnosis not present

## 2018-09-27 DIAGNOSIS — R531 Weakness: Secondary | ICD-10-CM | POA: Diagnosis not present

## 2018-09-27 DIAGNOSIS — R2689 Other abnormalities of gait and mobility: Secondary | ICD-10-CM | POA: Diagnosis not present

## 2018-09-27 DIAGNOSIS — S81011D Laceration without foreign body, right knee, subsequent encounter: Secondary | ICD-10-CM | POA: Diagnosis not present

## 2018-09-27 DIAGNOSIS — M1710 Unilateral primary osteoarthritis, unspecified knee: Secondary | ICD-10-CM | POA: Diagnosis not present

## 2018-09-27 DIAGNOSIS — S43102D Unspecified dislocation of left acromioclavicular joint, subsequent encounter: Secondary | ICD-10-CM | POA: Diagnosis not present

## 2018-09-27 DIAGNOSIS — S93401A Sprain of unspecified ligament of right ankle, initial encounter: Secondary | ICD-10-CM | POA: Diagnosis not present

## 2018-10-01 DIAGNOSIS — S43102D Unspecified dislocation of left acromioclavicular joint, subsequent encounter: Secondary | ICD-10-CM | POA: Diagnosis not present

## 2018-10-01 DIAGNOSIS — S81011D Laceration without foreign body, right knee, subsequent encounter: Secondary | ICD-10-CM | POA: Diagnosis not present

## 2018-10-01 DIAGNOSIS — R531 Weakness: Secondary | ICD-10-CM | POA: Diagnosis not present

## 2018-10-01 DIAGNOSIS — R2689 Other abnormalities of gait and mobility: Secondary | ICD-10-CM | POA: Diagnosis not present

## 2018-10-01 DIAGNOSIS — M1710 Unilateral primary osteoarthritis, unspecified knee: Secondary | ICD-10-CM | POA: Diagnosis not present

## 2018-10-01 DIAGNOSIS — S93401A Sprain of unspecified ligament of right ankle, initial encounter: Secondary | ICD-10-CM | POA: Diagnosis not present

## 2018-10-02 DIAGNOSIS — S93401A Sprain of unspecified ligament of right ankle, initial encounter: Secondary | ICD-10-CM | POA: Diagnosis not present

## 2018-10-02 DIAGNOSIS — M25512 Pain in left shoulder: Secondary | ICD-10-CM | POA: Diagnosis not present

## 2018-10-02 DIAGNOSIS — R2689 Other abnormalities of gait and mobility: Secondary | ICD-10-CM | POA: Diagnosis not present

## 2018-10-02 DIAGNOSIS — S43102D Unspecified dislocation of left acromioclavicular joint, subsequent encounter: Secondary | ICD-10-CM | POA: Diagnosis not present

## 2018-10-02 DIAGNOSIS — M1710 Unilateral primary osteoarthritis, unspecified knee: Secondary | ICD-10-CM | POA: Diagnosis not present

## 2018-10-02 DIAGNOSIS — M25571 Pain in right ankle and joints of right foot: Secondary | ICD-10-CM | POA: Diagnosis not present

## 2018-10-02 DIAGNOSIS — R531 Weakness: Secondary | ICD-10-CM | POA: Diagnosis not present

## 2018-10-02 DIAGNOSIS — S81011D Laceration without foreign body, right knee, subsequent encounter: Secondary | ICD-10-CM | POA: Diagnosis not present

## 2018-10-04 DIAGNOSIS — M1710 Unilateral primary osteoarthritis, unspecified knee: Secondary | ICD-10-CM | POA: Diagnosis not present

## 2018-10-04 DIAGNOSIS — S81011D Laceration without foreign body, right knee, subsequent encounter: Secondary | ICD-10-CM | POA: Diagnosis not present

## 2018-10-04 DIAGNOSIS — R531 Weakness: Secondary | ICD-10-CM | POA: Diagnosis not present

## 2018-10-04 DIAGNOSIS — R2689 Other abnormalities of gait and mobility: Secondary | ICD-10-CM | POA: Diagnosis not present

## 2018-10-04 DIAGNOSIS — S43102D Unspecified dislocation of left acromioclavicular joint, subsequent encounter: Secondary | ICD-10-CM | POA: Diagnosis not present

## 2018-10-04 DIAGNOSIS — S93401A Sprain of unspecified ligament of right ankle, initial encounter: Secondary | ICD-10-CM | POA: Diagnosis not present

## 2018-10-09 DIAGNOSIS — S43102D Unspecified dislocation of left acromioclavicular joint, subsequent encounter: Secondary | ICD-10-CM | POA: Diagnosis not present

## 2018-10-09 DIAGNOSIS — M1710 Unilateral primary osteoarthritis, unspecified knee: Secondary | ICD-10-CM | POA: Diagnosis not present

## 2018-10-09 DIAGNOSIS — R2689 Other abnormalities of gait and mobility: Secondary | ICD-10-CM | POA: Diagnosis not present

## 2018-10-09 DIAGNOSIS — S81011D Laceration without foreign body, right knee, subsequent encounter: Secondary | ICD-10-CM | POA: Diagnosis not present

## 2018-10-09 DIAGNOSIS — R531 Weakness: Secondary | ICD-10-CM | POA: Diagnosis not present

## 2018-10-09 DIAGNOSIS — S93401A Sprain of unspecified ligament of right ankle, initial encounter: Secondary | ICD-10-CM | POA: Diagnosis not present

## 2018-10-11 DIAGNOSIS — S43102D Unspecified dislocation of left acromioclavicular joint, subsequent encounter: Secondary | ICD-10-CM | POA: Diagnosis not present

## 2018-10-11 DIAGNOSIS — R531 Weakness: Secondary | ICD-10-CM | POA: Diagnosis not present

## 2018-10-11 DIAGNOSIS — S93401A Sprain of unspecified ligament of right ankle, initial encounter: Secondary | ICD-10-CM | POA: Diagnosis not present

## 2018-10-11 DIAGNOSIS — M1710 Unilateral primary osteoarthritis, unspecified knee: Secondary | ICD-10-CM | POA: Diagnosis not present

## 2018-10-11 DIAGNOSIS — S81011D Laceration without foreign body, right knee, subsequent encounter: Secondary | ICD-10-CM | POA: Diagnosis not present

## 2018-10-11 DIAGNOSIS — R2689 Other abnormalities of gait and mobility: Secondary | ICD-10-CM | POA: Diagnosis not present

## 2018-10-15 DIAGNOSIS — R531 Weakness: Secondary | ICD-10-CM | POA: Diagnosis not present

## 2018-10-15 DIAGNOSIS — S43102D Unspecified dislocation of left acromioclavicular joint, subsequent encounter: Secondary | ICD-10-CM | POA: Diagnosis not present

## 2018-10-15 DIAGNOSIS — R2689 Other abnormalities of gait and mobility: Secondary | ICD-10-CM | POA: Diagnosis not present

## 2018-10-15 DIAGNOSIS — M1710 Unilateral primary osteoarthritis, unspecified knee: Secondary | ICD-10-CM | POA: Diagnosis not present

## 2018-10-15 DIAGNOSIS — S93401A Sprain of unspecified ligament of right ankle, initial encounter: Secondary | ICD-10-CM | POA: Diagnosis not present

## 2018-10-15 DIAGNOSIS — S81011D Laceration without foreign body, right knee, subsequent encounter: Secondary | ICD-10-CM | POA: Diagnosis not present

## 2018-10-16 DIAGNOSIS — S93401A Sprain of unspecified ligament of right ankle, initial encounter: Secondary | ICD-10-CM | POA: Diagnosis not present

## 2018-10-16 DIAGNOSIS — S81011D Laceration without foreign body, right knee, subsequent encounter: Secondary | ICD-10-CM | POA: Diagnosis not present

## 2018-10-16 DIAGNOSIS — R2689 Other abnormalities of gait and mobility: Secondary | ICD-10-CM | POA: Diagnosis not present

## 2018-10-16 DIAGNOSIS — M1710 Unilateral primary osteoarthritis, unspecified knee: Secondary | ICD-10-CM | POA: Diagnosis not present

## 2018-10-16 DIAGNOSIS — R531 Weakness: Secondary | ICD-10-CM | POA: Diagnosis not present

## 2018-10-16 DIAGNOSIS — S43102D Unspecified dislocation of left acromioclavicular joint, subsequent encounter: Secondary | ICD-10-CM | POA: Diagnosis not present

## 2018-10-18 DIAGNOSIS — R531 Weakness: Secondary | ICD-10-CM | POA: Diagnosis not present

## 2018-10-18 DIAGNOSIS — M1710 Unilateral primary osteoarthritis, unspecified knee: Secondary | ICD-10-CM | POA: Diagnosis not present

## 2018-10-18 DIAGNOSIS — S93401A Sprain of unspecified ligament of right ankle, initial encounter: Secondary | ICD-10-CM | POA: Diagnosis not present

## 2018-10-18 DIAGNOSIS — S43102D Unspecified dislocation of left acromioclavicular joint, subsequent encounter: Secondary | ICD-10-CM | POA: Diagnosis not present

## 2018-10-18 DIAGNOSIS — R2689 Other abnormalities of gait and mobility: Secondary | ICD-10-CM | POA: Diagnosis not present

## 2018-10-18 DIAGNOSIS — S81011D Laceration without foreign body, right knee, subsequent encounter: Secondary | ICD-10-CM | POA: Diagnosis not present

## 2018-10-23 DIAGNOSIS — G4733 Obstructive sleep apnea (adult) (pediatric): Secondary | ICD-10-CM | POA: Diagnosis not present

## 2018-10-23 DIAGNOSIS — I452 Bifascicular block: Secondary | ICD-10-CM | POA: Diagnosis not present

## 2018-10-23 DIAGNOSIS — I10 Essential (primary) hypertension: Secondary | ICD-10-CM | POA: Diagnosis not present

## 2018-10-23 DIAGNOSIS — M170 Bilateral primary osteoarthritis of knee: Secondary | ICD-10-CM | POA: Diagnosis not present

## 2018-10-23 DIAGNOSIS — M1712 Unilateral primary osteoarthritis, left knee: Secondary | ICD-10-CM | POA: Diagnosis not present

## 2018-10-24 ENCOUNTER — Telehealth: Payer: Self-pay | Admitting: *Deleted

## 2018-10-24 NOTE — Telephone Encounter (Signed)
   Primary Cardiologist:[name], MD  Chart reviewed as part of pre-operative protocol coverage. Because of Richard Berry's past medical history and time since last visit, he/she will require a follow-up visit in order to better assess preoperative cardiovascular risk.  Pre-op covering staff: - Please schedule appointment and call patient to inform them. - Please contact requesting surgeon's office via preferred method (i.e, phone, fax) to inform them of need for appointment prior to surgery.  If applicable, this message will also be routed to pharmacy pool and/or primary cardiologist for input on holding anticoagulant/antiplatelet agent as requested below so that this information is available at time of patient's appointment.   [place], [place]  10/24/2018, 3:19 PM

## 2018-10-24 NOTE — Telephone Encounter (Signed)
   [facility] Pre-operative Risk Assessment    Request for surgical clearance:  1. What type of surgery is being performed? LEFT KNEE REPLACEMENT    2. When is this surgery scheduled? 11/20/18   3. What type of clearance is required (medical clearance vs. Pharmacy clearance to hold med vs. Both)? MEDICAL  4. Are there any medications that need to be held prior to surgery and how long? NON LISTED    5. Practice name and name of physician performing surgery? [facility]; DR. Landis Gandy   6. What is your office phone number [phone]    7.   What is your office fax number [phone]  8.   Anesthesia type (None, local, MAC, general) ? GENERAL WITH SPINAL   Julaine Hua 10/24/2018, 9:24 AM  _________________________________________________________________   (provider comments below)

## 2018-10-25 NOTE — Telephone Encounter (Signed)
Per Almyra Deforest, PA, please call pt to schedule appt for clearance. Thanks!

## 2018-10-25 NOTE — Telephone Encounter (Signed)
Successfully faxed recommendation via Epic fax to requesting office.

## 2018-11-01 DIAGNOSIS — S81011D Laceration without foreign body, right knee, subsequent encounter: Secondary | ICD-10-CM | POA: Diagnosis not present

## 2018-11-01 DIAGNOSIS — S43102D Unspecified dislocation of left acromioclavicular joint, subsequent encounter: Secondary | ICD-10-CM | POA: Diagnosis not present

## 2018-11-01 DIAGNOSIS — M25512 Pain in left shoulder: Secondary | ICD-10-CM | POA: Diagnosis not present

## 2018-11-01 DIAGNOSIS — M1710 Unilateral primary osteoarthritis, unspecified knee: Secondary | ICD-10-CM | POA: Diagnosis not present

## 2018-11-01 DIAGNOSIS — S93401A Sprain of unspecified ligament of right ankle, initial encounter: Secondary | ICD-10-CM | POA: Diagnosis not present

## 2018-11-01 DIAGNOSIS — R2689 Other abnormalities of gait and mobility: Secondary | ICD-10-CM | POA: Diagnosis not present

## 2018-11-01 DIAGNOSIS — R531 Weakness: Secondary | ICD-10-CM | POA: Diagnosis not present

## 2018-11-01 DIAGNOSIS — M25571 Pain in right ankle and joints of right foot: Secondary | ICD-10-CM | POA: Diagnosis not present

## 2018-11-02 NOTE — Progress Notes (Signed)
CARDIOLOGY OFFICE NOTE  Date:  11/06/2018    Clearnce Hasten Date of Birth: [birth year] Medical Record [id number]  PCP:  Antony Contras, MD  Cardiologist:  Tamala Julian  Chief Complaint  Patient presents with   Pre-op Exam    Seen for Dr. Tamala Julian    History of Present Illness: BAYLE YARA is a 73 y.o. male who presents today for a pre op clearance visit. Seen for Dr. Tamala Julian..   He has a history of morbid obesity, sleep apnea, bifascicular block on EKG, essential hypertension, suspected diastolic heart failure, and type 2 DM.   Last seen by Dr. Tamala Julian in July of 2019 - was doing well - consistently exercising. We were going to see back prn.   The patient does not have symptoms concerning for COVID-19 infection (fever, chills, cough, or new shortness of breath).   Comes in today. Here alone. Needing L TKR at [facility] - scheduled for 11/20/2018.  Needing cardiac clearance. He basically has "bone on bone" and needs knee replacement. He is feeling well. No chest pain. Not short of breath. No syncope. Not dizzy unless he bends over. Still actively losing weight. Riding his bike on a regular basis - rode 20 miles day before yesterday. Does have issues with prolonged standing - that makes his knee hurts. He will be going to [facility] in [place] for his rehab.   Past Medical History:  Diagnosis Date   Diabetes mellitus without complication ([facility])    GERD (gastroesophageal reflux disease)    Hyperlipidemia    Hypertension    Obesity    Sleep apnea    Sleep apnea    Thyroid disease     Past Surgical History:  Procedure Laterality Date   none       Medications: Current Meds  Medication Sig   allopurinol (ZYLOPRIM) 100 MG tablet Take 100 mg by mouth daily. For gout   amLODipine (NORVASC) 5 MG tablet Take 1 tablet (5 mg total) by mouth daily.   atorvastatin (LIPITOR) 40 MG tablet Take 1 tablet by mouth at bedtime.   beta carotene w/minerals (OCUVITE) tablet Take 1  tablet by mouth 2 (two) times daily.   Cholecalciferol (D-3-5) 5000 UNITS capsule Take 5,000 Units by mouth daily.   clonazePAM (KLONOPIN) 0.5 MG tablet Take 0.25-0.5 mg by mouth at bedtime as needed for sleep.   cyanocobalamin 1000 MCG tablet Take 1,000 mcg by mouth daily.   cyclobenzaprine (FLEXERIL) 10 MG tablet Take 10 mg by mouth 3 (three) times daily as needed for muscle spasms.   finasteride (PROSCAR) 5 MG tablet TAKE 1 TABLET BY MOUTH EVERY MORNING   hydrochlorothiazide (HYDRODIURIL) 12.5 MG tablet Take 12.5 mg by mouth daily as needed (FOR FLUID).    JARDIANCE 10 MG TABS tablet Take 10 mg by mouth daily.   levothyroxine (SYNTHROID, LEVOTHROID) 50 MCG tablet Take 50 mcg by mouth daily.   loratadine (CLARITIN) 10 MG tablet Take 10 mg by mouth daily as needed for allergies.   meloxicam (MOBIC) 15 MG tablet Take 7.5 mg by mouth 2 (two) times daily.    metoprolol succinate (TOPROL-XL) 50 MG 24 hr tablet Take 50 mg by mouth daily.   Misc Natural Products (OSTEO BI-FLEX TRIPLE STRENGTH PO) Take 2 tablets by mouth daily.   NON FORMULARY Use CPAP machine as directed.   traMADol (ULTRAM) 50 MG tablet TAKE 1 TABLET BY MOUTH EVERY 6 HOURS AS NEEDED FOR PAIN     Allergies: Allergies  Allergen Reactions   Cozaar [Losartan Potassium]    Flomax [Tamsulosin Hcl]    Lotensin [Benazepril Hcl]    Maxzide [Triamterene-Hctz]    Ppd [Tuberculin Purified Protein Derivative]     Social History: The patient  reports that he has never smoked. He has never used smokeless tobacco. He reports that he does not drink alcohol or use drugs.   Family History: The patient's family history includes Blindness in his mother; Diabetes in his sister and another family member; Healthy in his brother; Heart attack in his father; Heart disease in his father, mother, sister, and another family member; Hyperlipidemia in an other family member; Hypertension in an other family member.   Review of  Systems: Please see the history of present illness.   All other systems are reviewed and negative.   Physical Exam: VS:  BP 110/62    Pulse 62    Ht 5' 11.5" (1.816 m)    Wt 274 lb 12.8 oz (124.6 kg)    SpO2 97%    BMI 37.79 kg/m  .  BMI Body mass index is 37.79 kg/m.  Wt Readings from Last 3 Encounters:  11/06/18 274 lb 12.8 oz (124.6 kg)  08/07/17 285 lb 12.8 oz (129.6 kg)  01/16/15 (!) 317 lb 6.4 oz (144 kg)    General: Pleasant. Well developed, well nourished and in no acute distress.  He continues to lose weight. Down 11 pounds since last July.  HEENT: Normal.  Neck: Supple, no JVD, carotid bruits, or masses noted.  Cardiac: Regular rate and rhythm. No murmurs, rubs, or gallops. No edema.  Respiratory:  Lungs are clear to auscultation bilaterally with normal work of breathing.  GI: Soft and nontender.  MS: No deformity or atrophy. Gait and ROM intact.  Skin: Warm and dry. Color is normal.  Neuro:  Strength and sensation are intact and no gross focal deficits noted.  Psych: Alert, appropriate and with normal affect.   LABORATORY DATA:  EKG:  EKG is ordered today. This demonstrates NSR with bifascicular block - reviewed with Dr. Tamala Julian today.  No results found for: WBC, HGB, HCT, PLT, GLUCOSE, CHOL, TRIG, HDL, LDLDIRECT, LDLCALC, ALT, AST, NA, K, CL, CREATININE, BUN, CO2, TSH, PSA, INR, GLUF, HGBA1C, MICROALBUR     BNP (last 3 results) No results for input(s): BNP in the last 8760 hours.  ProBNP (last 3 results) No results for input(s): PROBNP in the last 8760 hours.   Other Studies Reviewed Today:  2D Doppler echocardiogram 2014:  Study Conclusions  - Left ventricle: The cavity size was normal. Wall thickness was increased in a pattern of moderate LVH. Doppler parameters are consistent with abnormal left ventricular relaxation (grade 1 diastolic dysfunction). Doppler parameters are consistent with high ventricular filling pressure. - Left atrium:  The atrium was mildly dilated.  ASSESSMENT & PLAN:    1. Pre op clearance - needing upcoming knee surgery - he is doing well. He has no cardiac symptoms. He is able to complete over 4 mets of activity with no issue. He is felt to be an acceptable candidate for his surgery. Discussed with Dr. Tamala Julian who agrees.   2. HTN - BP is fine - I did ask him to keep check of his BP as he continues to lose weight - may need medication reduction.   3. Bifascicular block - chronic - PR is ok today. Remains on low dose beta blocker therapy. He understands to let us know if he has any dizzy/passing out  spells. No indication for PPM at this time.   4. OSA   5. Obesity - continues to actively lose weight - he is encouraged to keep up the good work. He has made good progress over the past several years.   5. COVID-19 Education: The signs and symptoms of COVID-19 were discussed with the patient and how to seek care for testing (follow up with PCP or arrange E-visit).  The importance of social distancing, staying at home, hand hygiene and wearing a mask when out in public were discussed today.  Current medicines are reviewed with the patient today.  The patient does not have concerns regarding medicines other than what has been noted above.  The following changes have been made:  See above.  Labs/ tests ordered today include:    Orders Placed This Encounter  Procedures   EKG 12-Lead     Disposition:   FU with [place] in a year.   Patient is agreeable to this plan and will call if any problems develop in the interim.   SignedTruitt Merle, NP  11/06/2018 9:00 AM  [facility] [address] [place], [place]  [address] Phone: [phone] Fax: [phone]

## 2018-11-06 ENCOUNTER — Encounter: Payer: Self-pay | Admitting: Nurse Practitioner

## 2018-11-06 ENCOUNTER — Other Ambulatory Visit: Payer: Self-pay

## 2018-11-06 ENCOUNTER — Ambulatory Visit (INDEPENDENT_AMBULATORY_CARE_PROVIDER_SITE_OTHER): Payer: Medicare Other | Admitting: Nurse Practitioner

## 2018-11-06 VITALS — BP 110/62 | HR 62 | Ht 71.5 in | Wt 274.8 lb

## 2018-11-06 DIAGNOSIS — I452 Bifascicular block: Secondary | ICD-10-CM | POA: Diagnosis not present

## 2018-11-06 DIAGNOSIS — R531 Weakness: Secondary | ICD-10-CM | POA: Diagnosis not present

## 2018-11-06 DIAGNOSIS — S93401A Sprain of unspecified ligament of right ankle, initial encounter: Secondary | ICD-10-CM | POA: Diagnosis not present

## 2018-11-06 DIAGNOSIS — S81011D Laceration without foreign body, right knee, subsequent encounter: Secondary | ICD-10-CM | POA: Diagnosis not present

## 2018-11-06 DIAGNOSIS — M1710 Unilateral primary osteoarthritis, unspecified knee: Secondary | ICD-10-CM | POA: Diagnosis not present

## 2018-11-06 DIAGNOSIS — S43102D Unspecified dislocation of left acromioclavicular joint, subsequent encounter: Secondary | ICD-10-CM | POA: Diagnosis not present

## 2018-11-06 DIAGNOSIS — R2689 Other abnormalities of gait and mobility: Secondary | ICD-10-CM | POA: Diagnosis not present

## 2018-11-06 DIAGNOSIS — Z7189 Other specified counseling: Secondary | ICD-10-CM | POA: Diagnosis not present

## 2018-11-06 DIAGNOSIS — I10 Essential (primary) hypertension: Secondary | ICD-10-CM

## 2018-11-06 DIAGNOSIS — Z01810 Encounter for preprocedural cardiovascular examination: Secondary | ICD-10-CM | POA: Diagnosis not present

## 2018-11-06 NOTE — Patient Instructions (Addendum)
After Visit Summary:  We will be checking the following labs today - NONE   Medication Instructions:    Continue with your current medicines.    If you need a refill on your cardiac medications before your next appointment, please call your pharmacy.     Testing/Procedures To Be Arranged:  N/A  Follow-Up:   See Dr. Tamala Julian in one year.  You will receive a reminder letter in the mail two months in advance. If you don't receive a letter, please call our office to schedule the follow-up appointment.   At [facility], you and your health needs are our priority.  As part of our continuing mission to provide you with exceptional heart care, we have created designated Provider Care Teams.  These Care Teams include your primary Cardiologist (physician) and Advanced Practice Providers (APPs -  Physician Assistants and Nurse Practitioners) who all work together to provide you with the care you need, when you need it.  Special Instructions:  . Stay safe, stay home, wash your hands for at least 20 seconds and wear a mask when out in public.  . It was good to talk with you today.  . I will send a note down to [facility] today for your clearance.    Call the [place] office at [phone] if you have any questions, problems or concerns.

## 2018-11-08 DIAGNOSIS — S43102D Unspecified dislocation of left acromioclavicular joint, subsequent encounter: Secondary | ICD-10-CM | POA: Diagnosis not present

## 2018-11-08 DIAGNOSIS — G4733 Obstructive sleep apnea (adult) (pediatric): Secondary | ICD-10-CM | POA: Diagnosis not present

## 2018-11-08 DIAGNOSIS — I10 Essential (primary) hypertension: Secondary | ICD-10-CM | POA: Diagnosis not present

## 2018-11-08 DIAGNOSIS — M170 Bilateral primary osteoarthritis of knee: Secondary | ICD-10-CM | POA: Diagnosis not present

## 2018-11-08 DIAGNOSIS — S93401A Sprain of unspecified ligament of right ankle, initial encounter: Secondary | ICD-10-CM | POA: Diagnosis not present

## 2018-11-08 DIAGNOSIS — E119 Type 2 diabetes mellitus without complications: Secondary | ICD-10-CM | POA: Diagnosis not present

## 2018-11-08 DIAGNOSIS — Z01818 Encounter for other preprocedural examination: Secondary | ICD-10-CM | POA: Diagnosis not present

## 2018-11-08 DIAGNOSIS — I452 Bifascicular block: Secondary | ICD-10-CM | POA: Diagnosis not present

## 2018-11-08 DIAGNOSIS — R531 Weakness: Secondary | ICD-10-CM | POA: Diagnosis not present

## 2018-11-08 DIAGNOSIS — M1710 Unilateral primary osteoarthritis, unspecified knee: Secondary | ICD-10-CM | POA: Diagnosis not present

## 2018-11-08 DIAGNOSIS — S81011D Laceration without foreign body, right knee, subsequent encounter: Secondary | ICD-10-CM | POA: Diagnosis not present

## 2018-11-08 DIAGNOSIS — M1712 Unilateral primary osteoarthritis, left knee: Secondary | ICD-10-CM | POA: Diagnosis not present

## 2018-11-08 DIAGNOSIS — R2689 Other abnormalities of gait and mobility: Secondary | ICD-10-CM | POA: Diagnosis not present

## 2018-11-09 ENCOUNTER — Telehealth: Payer: Self-pay | Admitting: Nurse Practitioner

## 2018-11-09 NOTE — Telephone Encounter (Signed)
No note needed 

## 2018-11-09 NOTE — Telephone Encounter (Signed)
New Message   NP Weyman Pedro from [facility] is calling to get the tracings from the EKG done on 11/06/18 faxed over to her. Fax # is [phone].

## 2018-11-13 DIAGNOSIS — M1710 Unilateral primary osteoarthritis, unspecified knee: Secondary | ICD-10-CM | POA: Diagnosis not present

## 2018-11-13 DIAGNOSIS — S81011D Laceration without foreign body, right knee, subsequent encounter: Secondary | ICD-10-CM | POA: Diagnosis not present

## 2018-11-13 DIAGNOSIS — S93401A Sprain of unspecified ligament of right ankle, initial encounter: Secondary | ICD-10-CM | POA: Diagnosis not present

## 2018-11-13 DIAGNOSIS — S43102D Unspecified dislocation of left acromioclavicular joint, subsequent encounter: Secondary | ICD-10-CM | POA: Diagnosis not present

## 2018-11-13 DIAGNOSIS — R2689 Other abnormalities of gait and mobility: Secondary | ICD-10-CM | POA: Diagnosis not present

## 2018-11-13 DIAGNOSIS — R531 Weakness: Secondary | ICD-10-CM | POA: Diagnosis not present

## 2018-11-15 DIAGNOSIS — S93401A Sprain of unspecified ligament of right ankle, initial encounter: Secondary | ICD-10-CM | POA: Diagnosis not present

## 2018-11-15 DIAGNOSIS — R2689 Other abnormalities of gait and mobility: Secondary | ICD-10-CM | POA: Diagnosis not present

## 2018-11-15 DIAGNOSIS — S43102D Unspecified dislocation of left acromioclavicular joint, subsequent encounter: Secondary | ICD-10-CM | POA: Diagnosis not present

## 2018-11-15 DIAGNOSIS — S81011D Laceration without foreign body, right knee, subsequent encounter: Secondary | ICD-10-CM | POA: Diagnosis not present

## 2018-11-15 DIAGNOSIS — M1710 Unilateral primary osteoarthritis, unspecified knee: Secondary | ICD-10-CM | POA: Diagnosis not present

## 2018-11-15 DIAGNOSIS — R531 Weakness: Secondary | ICD-10-CM | POA: Diagnosis not present

## 2018-11-20 DIAGNOSIS — Z79899 Other long term (current) drug therapy: Secondary | ICD-10-CM | POA: Diagnosis not present

## 2018-11-20 DIAGNOSIS — M1712 Unilateral primary osteoarthritis, left knee: Secondary | ICD-10-CM | POA: Diagnosis present

## 2018-11-20 DIAGNOSIS — M21162 Varus deformity, not elsewhere classified, left knee: Secondary | ICD-10-CM | POA: Diagnosis present

## 2018-11-20 DIAGNOSIS — G4733 Obstructive sleep apnea (adult) (pediatric): Secondary | ICD-10-CM | POA: Diagnosis present

## 2018-11-20 DIAGNOSIS — K219 Gastro-esophageal reflux disease without esophagitis: Secondary | ICD-10-CM | POA: Diagnosis present

## 2018-11-20 DIAGNOSIS — G8918 Other acute postprocedural pain: Secondary | ICD-10-CM | POA: Diagnosis not present

## 2018-11-20 DIAGNOSIS — E785 Hyperlipidemia, unspecified: Secondary | ICD-10-CM | POA: Diagnosis present

## 2018-11-20 DIAGNOSIS — E669 Obesity, unspecified: Secondary | ICD-10-CM | POA: Diagnosis present

## 2018-11-20 DIAGNOSIS — M109 Gout, unspecified: Secondary | ICD-10-CM | POA: Diagnosis present

## 2018-11-20 DIAGNOSIS — I452 Bifascicular block: Secondary | ICD-10-CM | POA: Diagnosis present

## 2018-11-20 DIAGNOSIS — R918 Other nonspecific abnormal finding of lung field: Secondary | ICD-10-CM | POA: Diagnosis present

## 2018-11-20 DIAGNOSIS — Z888 Allergy status to other drugs, medicaments and biological substances status: Secondary | ICD-10-CM | POA: Diagnosis not present

## 2018-11-20 DIAGNOSIS — I10 Essential (primary) hypertension: Secondary | ICD-10-CM | POA: Diagnosis present

## 2018-11-20 DIAGNOSIS — Z6837 Body mass index (BMI) 37.0-37.9, adult: Secondary | ICD-10-CM | POA: Diagnosis not present

## 2018-11-20 DIAGNOSIS — M23612 Other spontaneous disruption of anterior cruciate ligament of left knee: Secondary | ICD-10-CM | POA: Diagnosis not present

## 2018-11-20 DIAGNOSIS — Z7984 Long term (current) use of oral hypoglycemic drugs: Secondary | ICD-10-CM | POA: Diagnosis not present

## 2018-11-20 DIAGNOSIS — J302 Other seasonal allergic rhinitis: Secondary | ICD-10-CM | POA: Diagnosis present

## 2018-11-20 DIAGNOSIS — Z791 Long term (current) use of non-steroidal anti-inflammatories (NSAID): Secondary | ICD-10-CM | POA: Diagnosis not present

## 2018-11-20 DIAGNOSIS — E119 Type 2 diabetes mellitus without complications: Secondary | ICD-10-CM | POA: Diagnosis present

## 2018-11-20 DIAGNOSIS — Z87442 Personal history of urinary calculi: Secondary | ICD-10-CM | POA: Diagnosis not present

## 2018-11-20 DIAGNOSIS — Z20828 Contact with and (suspected) exposure to other viral communicable diseases: Secondary | ICD-10-CM | POA: Diagnosis present

## 2018-11-20 DIAGNOSIS — E039 Hypothyroidism, unspecified: Secondary | ICD-10-CM | POA: Diagnosis present

## 2018-11-20 DIAGNOSIS — Z887 Allergy status to serum and vaccine status: Secondary | ICD-10-CM | POA: Diagnosis not present

## 2018-11-20 DIAGNOSIS — K649 Unspecified hemorrhoids: Secondary | ICD-10-CM | POA: Diagnosis present

## 2018-11-20 DIAGNOSIS — N400 Enlarged prostate without lower urinary tract symptoms: Secondary | ICD-10-CM | POA: Diagnosis present

## 2018-12-04 DIAGNOSIS — Z471 Aftercare following joint replacement surgery: Secondary | ICD-10-CM | POA: Diagnosis not present

## 2018-12-04 DIAGNOSIS — Z96652 Presence of left artificial knee joint: Secondary | ICD-10-CM | POA: Diagnosis not present

## 2019-01-02 DIAGNOSIS — M1710 Unilateral primary osteoarthritis, unspecified knee: Secondary | ICD-10-CM | POA: Diagnosis not present

## 2019-01-02 DIAGNOSIS — M6281 Muscle weakness (generalized): Secondary | ICD-10-CM | POA: Diagnosis not present

## 2019-01-02 DIAGNOSIS — Z96659 Presence of unspecified artificial knee joint: Secondary | ICD-10-CM | POA: Diagnosis not present

## 2019-01-02 DIAGNOSIS — M25561 Pain in right knee: Secondary | ICD-10-CM | POA: Diagnosis not present

## 2019-01-02 DIAGNOSIS — R2689 Other abnormalities of gait and mobility: Secondary | ICD-10-CM | POA: Diagnosis not present

## 2019-01-04 DIAGNOSIS — Z96659 Presence of unspecified artificial knee joint: Secondary | ICD-10-CM | POA: Diagnosis not present

## 2019-01-04 DIAGNOSIS — M6281 Muscle weakness (generalized): Secondary | ICD-10-CM | POA: Diagnosis not present

## 2019-01-04 DIAGNOSIS — M25561 Pain in right knee: Secondary | ICD-10-CM | POA: Diagnosis not present

## 2019-01-04 DIAGNOSIS — R2689 Other abnormalities of gait and mobility: Secondary | ICD-10-CM | POA: Diagnosis not present

## 2019-01-04 DIAGNOSIS — M1710 Unilateral primary osteoarthritis, unspecified knee: Secondary | ICD-10-CM | POA: Diagnosis not present

## 2019-01-07 DIAGNOSIS — M1710 Unilateral primary osteoarthritis, unspecified knee: Secondary | ICD-10-CM | POA: Diagnosis not present

## 2019-01-07 DIAGNOSIS — M6281 Muscle weakness (generalized): Secondary | ICD-10-CM | POA: Diagnosis not present

## 2019-01-07 DIAGNOSIS — R2689 Other abnormalities of gait and mobility: Secondary | ICD-10-CM | POA: Diagnosis not present

## 2019-01-07 DIAGNOSIS — M25561 Pain in right knee: Secondary | ICD-10-CM | POA: Diagnosis not present

## 2019-01-07 DIAGNOSIS — Z96659 Presence of unspecified artificial knee joint: Secondary | ICD-10-CM | POA: Diagnosis not present

## 2019-01-09 DIAGNOSIS — Z96659 Presence of unspecified artificial knee joint: Secondary | ICD-10-CM | POA: Diagnosis not present

## 2019-01-09 DIAGNOSIS — R2689 Other abnormalities of gait and mobility: Secondary | ICD-10-CM | POA: Diagnosis not present

## 2019-01-09 DIAGNOSIS — M6281 Muscle weakness (generalized): Secondary | ICD-10-CM | POA: Diagnosis not present

## 2019-01-09 DIAGNOSIS — M1710 Unilateral primary osteoarthritis, unspecified knee: Secondary | ICD-10-CM | POA: Diagnosis not present

## 2019-01-09 DIAGNOSIS — M25561 Pain in right knee: Secondary | ICD-10-CM | POA: Diagnosis not present

## 2019-01-10 DIAGNOSIS — R2689 Other abnormalities of gait and mobility: Secondary | ICD-10-CM | POA: Diagnosis not present

## 2019-01-10 DIAGNOSIS — M25561 Pain in right knee: Secondary | ICD-10-CM | POA: Diagnosis not present

## 2019-01-10 DIAGNOSIS — Z96659 Presence of unspecified artificial knee joint: Secondary | ICD-10-CM | POA: Diagnosis not present

## 2019-01-10 DIAGNOSIS — M6281 Muscle weakness (generalized): Secondary | ICD-10-CM | POA: Diagnosis not present

## 2019-01-10 DIAGNOSIS — M1710 Unilateral primary osteoarthritis, unspecified knee: Secondary | ICD-10-CM | POA: Diagnosis not present

## 2019-01-14 DIAGNOSIS — M25561 Pain in right knee: Secondary | ICD-10-CM | POA: Diagnosis not present

## 2019-01-14 DIAGNOSIS — R2689 Other abnormalities of gait and mobility: Secondary | ICD-10-CM | POA: Diagnosis not present

## 2019-01-14 DIAGNOSIS — M6281 Muscle weakness (generalized): Secondary | ICD-10-CM | POA: Diagnosis not present

## 2019-01-14 DIAGNOSIS — Z96659 Presence of unspecified artificial knee joint: Secondary | ICD-10-CM | POA: Diagnosis not present

## 2019-01-14 DIAGNOSIS — M1710 Unilateral primary osteoarthritis, unspecified knee: Secondary | ICD-10-CM | POA: Diagnosis not present

## 2019-01-16 DIAGNOSIS — M1710 Unilateral primary osteoarthritis, unspecified knee: Secondary | ICD-10-CM | POA: Diagnosis not present

## 2019-01-16 DIAGNOSIS — M25561 Pain in right knee: Secondary | ICD-10-CM | POA: Diagnosis not present

## 2019-01-16 DIAGNOSIS — M6281 Muscle weakness (generalized): Secondary | ICD-10-CM | POA: Diagnosis not present

## 2019-01-16 DIAGNOSIS — Z96659 Presence of unspecified artificial knee joint: Secondary | ICD-10-CM | POA: Diagnosis not present

## 2019-01-16 DIAGNOSIS — R2689 Other abnormalities of gait and mobility: Secondary | ICD-10-CM | POA: Diagnosis not present

## 2019-01-18 DIAGNOSIS — M1710 Unilateral primary osteoarthritis, unspecified knee: Secondary | ICD-10-CM | POA: Diagnosis not present

## 2019-01-18 DIAGNOSIS — M6281 Muscle weakness (generalized): Secondary | ICD-10-CM | POA: Diagnosis not present

## 2019-01-18 DIAGNOSIS — R2689 Other abnormalities of gait and mobility: Secondary | ICD-10-CM | POA: Diagnosis not present

## 2019-01-18 DIAGNOSIS — Z96659 Presence of unspecified artificial knee joint: Secondary | ICD-10-CM | POA: Diagnosis not present

## 2019-01-18 DIAGNOSIS — M25561 Pain in right knee: Secondary | ICD-10-CM | POA: Diagnosis not present

## 2019-01-21 DIAGNOSIS — M25561 Pain in right knee: Secondary | ICD-10-CM | POA: Diagnosis not present

## 2019-01-21 DIAGNOSIS — M1710 Unilateral primary osteoarthritis, unspecified knee: Secondary | ICD-10-CM | POA: Diagnosis not present

## 2019-01-21 DIAGNOSIS — M6281 Muscle weakness (generalized): Secondary | ICD-10-CM | POA: Diagnosis not present

## 2019-01-21 DIAGNOSIS — Z96659 Presence of unspecified artificial knee joint: Secondary | ICD-10-CM | POA: Diagnosis not present

## 2019-01-21 DIAGNOSIS — R2689 Other abnormalities of gait and mobility: Secondary | ICD-10-CM | POA: Diagnosis not present

## 2019-01-23 DIAGNOSIS — M1710 Unilateral primary osteoarthritis, unspecified knee: Secondary | ICD-10-CM | POA: Diagnosis not present

## 2019-01-23 DIAGNOSIS — M6281 Muscle weakness (generalized): Secondary | ICD-10-CM | POA: Diagnosis not present

## 2019-01-23 DIAGNOSIS — M25561 Pain in right knee: Secondary | ICD-10-CM | POA: Diagnosis not present

## 2019-01-23 DIAGNOSIS — R2689 Other abnormalities of gait and mobility: Secondary | ICD-10-CM | POA: Diagnosis not present

## 2019-01-23 DIAGNOSIS — Z96659 Presence of unspecified artificial knee joint: Secondary | ICD-10-CM | POA: Diagnosis not present

## 2019-01-28 DIAGNOSIS — M6281 Muscle weakness (generalized): Secondary | ICD-10-CM | POA: Diagnosis not present

## 2019-01-28 DIAGNOSIS — M1710 Unilateral primary osteoarthritis, unspecified knee: Secondary | ICD-10-CM | POA: Diagnosis not present

## 2019-01-28 DIAGNOSIS — M25561 Pain in right knee: Secondary | ICD-10-CM | POA: Diagnosis not present

## 2019-01-28 DIAGNOSIS — Z96659 Presence of unspecified artificial knee joint: Secondary | ICD-10-CM | POA: Diagnosis not present

## 2019-01-28 DIAGNOSIS — R2689 Other abnormalities of gait and mobility: Secondary | ICD-10-CM | POA: Diagnosis not present

## 2019-01-30 DIAGNOSIS — Z96659 Presence of unspecified artificial knee joint: Secondary | ICD-10-CM | POA: Diagnosis not present

## 2019-01-30 DIAGNOSIS — R2689 Other abnormalities of gait and mobility: Secondary | ICD-10-CM | POA: Diagnosis not present

## 2019-01-30 DIAGNOSIS — M6281 Muscle weakness (generalized): Secondary | ICD-10-CM | POA: Diagnosis not present

## 2019-01-30 DIAGNOSIS — M25561 Pain in right knee: Secondary | ICD-10-CM | POA: Diagnosis not present

## 2019-01-30 DIAGNOSIS — M1710 Unilateral primary osteoarthritis, unspecified knee: Secondary | ICD-10-CM | POA: Diagnosis not present

## 2019-01-31 DIAGNOSIS — M1710 Unilateral primary osteoarthritis, unspecified knee: Secondary | ICD-10-CM | POA: Diagnosis not present

## 2019-01-31 DIAGNOSIS — Z96659 Presence of unspecified artificial knee joint: Secondary | ICD-10-CM | POA: Diagnosis not present

## 2019-01-31 DIAGNOSIS — R2689 Other abnormalities of gait and mobility: Secondary | ICD-10-CM | POA: Diagnosis not present

## 2019-01-31 DIAGNOSIS — M6281 Muscle weakness (generalized): Secondary | ICD-10-CM | POA: Diagnosis not present

## 2019-01-31 DIAGNOSIS — M25561 Pain in right knee: Secondary | ICD-10-CM | POA: Diagnosis not present

## 2019-02-04 DIAGNOSIS — M6281 Muscle weakness (generalized): Secondary | ICD-10-CM | POA: Diagnosis not present

## 2019-02-04 DIAGNOSIS — Z96659 Presence of unspecified artificial knee joint: Secondary | ICD-10-CM | POA: Diagnosis not present

## 2019-02-04 DIAGNOSIS — M25561 Pain in right knee: Secondary | ICD-10-CM | POA: Diagnosis not present

## 2019-02-04 DIAGNOSIS — M1710 Unilateral primary osteoarthritis, unspecified knee: Secondary | ICD-10-CM | POA: Diagnosis not present

## 2019-02-04 DIAGNOSIS — R2689 Other abnormalities of gait and mobility: Secondary | ICD-10-CM | POA: Diagnosis not present

## 2019-02-06 DIAGNOSIS — M25561 Pain in right knee: Secondary | ICD-10-CM | POA: Diagnosis not present

## 2019-02-06 DIAGNOSIS — Z96659 Presence of unspecified artificial knee joint: Secondary | ICD-10-CM | POA: Diagnosis not present

## 2019-02-06 DIAGNOSIS — M6281 Muscle weakness (generalized): Secondary | ICD-10-CM | POA: Diagnosis not present

## 2019-02-06 DIAGNOSIS — R2689 Other abnormalities of gait and mobility: Secondary | ICD-10-CM | POA: Diagnosis not present

## 2019-02-06 DIAGNOSIS — M1710 Unilateral primary osteoarthritis, unspecified knee: Secondary | ICD-10-CM | POA: Diagnosis not present

## 2019-02-08 DIAGNOSIS — R2689 Other abnormalities of gait and mobility: Secondary | ICD-10-CM | POA: Diagnosis not present

## 2019-02-08 DIAGNOSIS — M1710 Unilateral primary osteoarthritis, unspecified knee: Secondary | ICD-10-CM | POA: Diagnosis not present

## 2019-02-08 DIAGNOSIS — M25561 Pain in right knee: Secondary | ICD-10-CM | POA: Diagnosis not present

## 2019-02-08 DIAGNOSIS — M6281 Muscle weakness (generalized): Secondary | ICD-10-CM | POA: Diagnosis not present

## 2019-02-08 DIAGNOSIS — Z96659 Presence of unspecified artificial knee joint: Secondary | ICD-10-CM | POA: Diagnosis not present

## 2019-02-11 DIAGNOSIS — M1710 Unilateral primary osteoarthritis, unspecified knee: Secondary | ICD-10-CM | POA: Diagnosis not present

## 2019-02-11 DIAGNOSIS — R2689 Other abnormalities of gait and mobility: Secondary | ICD-10-CM | POA: Diagnosis not present

## 2019-02-11 DIAGNOSIS — M6281 Muscle weakness (generalized): Secondary | ICD-10-CM | POA: Diagnosis not present

## 2019-02-11 DIAGNOSIS — M25561 Pain in right knee: Secondary | ICD-10-CM | POA: Diagnosis not present

## 2019-02-11 DIAGNOSIS — Z96659 Presence of unspecified artificial knee joint: Secondary | ICD-10-CM | POA: Diagnosis not present

## 2019-02-13 DIAGNOSIS — Z96659 Presence of unspecified artificial knee joint: Secondary | ICD-10-CM | POA: Diagnosis not present

## 2019-02-13 DIAGNOSIS — R2689 Other abnormalities of gait and mobility: Secondary | ICD-10-CM | POA: Diagnosis not present

## 2019-02-13 DIAGNOSIS — M25561 Pain in right knee: Secondary | ICD-10-CM | POA: Diagnosis not present

## 2019-02-13 DIAGNOSIS — M6281 Muscle weakness (generalized): Secondary | ICD-10-CM | POA: Diagnosis not present

## 2019-02-13 DIAGNOSIS — M1710 Unilateral primary osteoarthritis, unspecified knee: Secondary | ICD-10-CM | POA: Diagnosis not present

## 2019-02-15 DIAGNOSIS — M25561 Pain in right knee: Secondary | ICD-10-CM | POA: Diagnosis not present

## 2019-02-15 DIAGNOSIS — Z23 Encounter for immunization: Secondary | ICD-10-CM | POA: Diagnosis not present

## 2019-02-15 DIAGNOSIS — Z96659 Presence of unspecified artificial knee joint: Secondary | ICD-10-CM | POA: Diagnosis not present

## 2019-02-15 DIAGNOSIS — M1710 Unilateral primary osteoarthritis, unspecified knee: Secondary | ICD-10-CM | POA: Diagnosis not present

## 2019-02-15 DIAGNOSIS — M6281 Muscle weakness (generalized): Secondary | ICD-10-CM | POA: Diagnosis not present

## 2019-02-15 DIAGNOSIS — R2689 Other abnormalities of gait and mobility: Secondary | ICD-10-CM | POA: Diagnosis not present

## 2019-02-18 DIAGNOSIS — R2689 Other abnormalities of gait and mobility: Secondary | ICD-10-CM | POA: Diagnosis not present

## 2019-02-18 DIAGNOSIS — M6281 Muscle weakness (generalized): Secondary | ICD-10-CM | POA: Diagnosis not present

## 2019-02-18 DIAGNOSIS — Z96659 Presence of unspecified artificial knee joint: Secondary | ICD-10-CM | POA: Diagnosis not present

## 2019-02-18 DIAGNOSIS — M25561 Pain in right knee: Secondary | ICD-10-CM | POA: Diagnosis not present

## 2019-02-18 DIAGNOSIS — M1710 Unilateral primary osteoarthritis, unspecified knee: Secondary | ICD-10-CM | POA: Diagnosis not present

## 2019-02-20 DIAGNOSIS — Z96659 Presence of unspecified artificial knee joint: Secondary | ICD-10-CM | POA: Diagnosis not present

## 2019-02-20 DIAGNOSIS — M6281 Muscle weakness (generalized): Secondary | ICD-10-CM | POA: Diagnosis not present

## 2019-02-20 DIAGNOSIS — R2689 Other abnormalities of gait and mobility: Secondary | ICD-10-CM | POA: Diagnosis not present

## 2019-02-20 DIAGNOSIS — M25561 Pain in right knee: Secondary | ICD-10-CM | POA: Diagnosis not present

## 2019-02-20 DIAGNOSIS — M1710 Unilateral primary osteoarthritis, unspecified knee: Secondary | ICD-10-CM | POA: Diagnosis not present

## 2019-02-22 DIAGNOSIS — M6281 Muscle weakness (generalized): Secondary | ICD-10-CM | POA: Diagnosis not present

## 2019-02-22 DIAGNOSIS — R2689 Other abnormalities of gait and mobility: Secondary | ICD-10-CM | POA: Diagnosis not present

## 2019-02-22 DIAGNOSIS — M25561 Pain in right knee: Secondary | ICD-10-CM | POA: Diagnosis not present

## 2019-02-22 DIAGNOSIS — M1710 Unilateral primary osteoarthritis, unspecified knee: Secondary | ICD-10-CM | POA: Diagnosis not present

## 2019-02-22 DIAGNOSIS — Z96659 Presence of unspecified artificial knee joint: Secondary | ICD-10-CM | POA: Diagnosis not present

## 2019-02-25 DIAGNOSIS — Z96659 Presence of unspecified artificial knee joint: Secondary | ICD-10-CM | POA: Diagnosis not present

## 2019-02-25 DIAGNOSIS — M6281 Muscle weakness (generalized): Secondary | ICD-10-CM | POA: Diagnosis not present

## 2019-02-25 DIAGNOSIS — M1710 Unilateral primary osteoarthritis, unspecified knee: Secondary | ICD-10-CM | POA: Diagnosis not present

## 2019-02-25 DIAGNOSIS — R2689 Other abnormalities of gait and mobility: Secondary | ICD-10-CM | POA: Diagnosis not present

## 2019-02-25 DIAGNOSIS — M25561 Pain in right knee: Secondary | ICD-10-CM | POA: Diagnosis not present

## 2019-02-27 DIAGNOSIS — M25561 Pain in right knee: Secondary | ICD-10-CM | POA: Diagnosis not present

## 2019-02-27 DIAGNOSIS — M6281 Muscle weakness (generalized): Secondary | ICD-10-CM | POA: Diagnosis not present

## 2019-02-27 DIAGNOSIS — Z96659 Presence of unspecified artificial knee joint: Secondary | ICD-10-CM | POA: Diagnosis not present

## 2019-02-27 DIAGNOSIS — M1710 Unilateral primary osteoarthritis, unspecified knee: Secondary | ICD-10-CM | POA: Diagnosis not present

## 2019-02-27 DIAGNOSIS — R2689 Other abnormalities of gait and mobility: Secondary | ICD-10-CM | POA: Diagnosis not present

## 2019-03-01 DIAGNOSIS — E1100 Type 2 diabetes mellitus with hyperosmolarity without nonketotic hyperglycemic-hyperosmolar coma (NKHHC): Secondary | ICD-10-CM | POA: Diagnosis not present

## 2019-03-01 DIAGNOSIS — Z01818 Encounter for other preprocedural examination: Secondary | ICD-10-CM | POA: Diagnosis not present

## 2019-03-04 DIAGNOSIS — M6281 Muscle weakness (generalized): Secondary | ICD-10-CM | POA: Diagnosis not present

## 2019-03-04 DIAGNOSIS — Z96659 Presence of unspecified artificial knee joint: Secondary | ICD-10-CM | POA: Diagnosis not present

## 2019-03-04 DIAGNOSIS — M25561 Pain in right knee: Secondary | ICD-10-CM | POA: Diagnosis not present

## 2019-03-04 DIAGNOSIS — M1710 Unilateral primary osteoarthritis, unspecified knee: Secondary | ICD-10-CM | POA: Diagnosis not present

## 2019-03-04 DIAGNOSIS — R2689 Other abnormalities of gait and mobility: Secondary | ICD-10-CM | POA: Diagnosis not present

## 2019-03-06 DIAGNOSIS — M1710 Unilateral primary osteoarthritis, unspecified knee: Secondary | ICD-10-CM | POA: Diagnosis not present

## 2019-03-06 DIAGNOSIS — Z96659 Presence of unspecified artificial knee joint: Secondary | ICD-10-CM | POA: Diagnosis not present

## 2019-03-06 DIAGNOSIS — M6281 Muscle weakness (generalized): Secondary | ICD-10-CM | POA: Diagnosis not present

## 2019-03-06 DIAGNOSIS — M25561 Pain in right knee: Secondary | ICD-10-CM | POA: Diagnosis not present

## 2019-03-06 DIAGNOSIS — R2689 Other abnormalities of gait and mobility: Secondary | ICD-10-CM | POA: Diagnosis not present

## 2019-03-08 DIAGNOSIS — M1710 Unilateral primary osteoarthritis, unspecified knee: Secondary | ICD-10-CM | POA: Diagnosis not present

## 2019-03-08 DIAGNOSIS — M6281 Muscle weakness (generalized): Secondary | ICD-10-CM | POA: Diagnosis not present

## 2019-03-08 DIAGNOSIS — M25561 Pain in right knee: Secondary | ICD-10-CM | POA: Diagnosis not present

## 2019-03-08 DIAGNOSIS — Z96659 Presence of unspecified artificial knee joint: Secondary | ICD-10-CM | POA: Diagnosis not present

## 2019-03-08 DIAGNOSIS — R2689 Other abnormalities of gait and mobility: Secondary | ICD-10-CM | POA: Diagnosis not present

## 2019-03-11 DIAGNOSIS — R2689 Other abnormalities of gait and mobility: Secondary | ICD-10-CM | POA: Diagnosis not present

## 2019-03-11 DIAGNOSIS — M25561 Pain in right knee: Secondary | ICD-10-CM | POA: Diagnosis not present

## 2019-03-11 DIAGNOSIS — M1710 Unilateral primary osteoarthritis, unspecified knee: Secondary | ICD-10-CM | POA: Diagnosis not present

## 2019-03-11 DIAGNOSIS — M6281 Muscle weakness (generalized): Secondary | ICD-10-CM | POA: Diagnosis not present

## 2019-03-11 DIAGNOSIS — Z96659 Presence of unspecified artificial knee joint: Secondary | ICD-10-CM | POA: Diagnosis not present

## 2019-03-13 DIAGNOSIS — M6281 Muscle weakness (generalized): Secondary | ICD-10-CM | POA: Diagnosis not present

## 2019-03-13 DIAGNOSIS — Z96659 Presence of unspecified artificial knee joint: Secondary | ICD-10-CM | POA: Diagnosis not present

## 2019-03-13 DIAGNOSIS — M25561 Pain in right knee: Secondary | ICD-10-CM | POA: Diagnosis not present

## 2019-03-13 DIAGNOSIS — R2689 Other abnormalities of gait and mobility: Secondary | ICD-10-CM | POA: Diagnosis not present

## 2019-03-13 DIAGNOSIS — M1710 Unilateral primary osteoarthritis, unspecified knee: Secondary | ICD-10-CM | POA: Diagnosis not present

## 2019-03-14 DIAGNOSIS — Z96659 Presence of unspecified artificial knee joint: Secondary | ICD-10-CM | POA: Diagnosis not present

## 2019-03-14 DIAGNOSIS — M6281 Muscle weakness (generalized): Secondary | ICD-10-CM | POA: Diagnosis not present

## 2019-03-14 DIAGNOSIS — M25561 Pain in right knee: Secondary | ICD-10-CM | POA: Diagnosis not present

## 2019-03-14 DIAGNOSIS — N529 Male erectile dysfunction, unspecified: Secondary | ICD-10-CM | POA: Diagnosis not present

## 2019-03-14 DIAGNOSIS — E119 Type 2 diabetes mellitus without complications: Secondary | ICD-10-CM | POA: Diagnosis not present

## 2019-03-14 DIAGNOSIS — E039 Hypothyroidism, unspecified: Secondary | ICD-10-CM | POA: Diagnosis not present

## 2019-03-14 DIAGNOSIS — M1710 Unilateral primary osteoarthritis, unspecified knee: Secondary | ICD-10-CM | POA: Diagnosis not present

## 2019-03-14 DIAGNOSIS — D692 Other nonthrombocytopenic purpura: Secondary | ICD-10-CM | POA: Diagnosis not present

## 2019-03-14 DIAGNOSIS — I10 Essential (primary) hypertension: Secondary | ICD-10-CM | POA: Diagnosis not present

## 2019-03-14 DIAGNOSIS — R2689 Other abnormalities of gait and mobility: Secondary | ICD-10-CM | POA: Diagnosis not present

## 2019-03-14 DIAGNOSIS — D3502 Benign neoplasm of left adrenal gland: Secondary | ICD-10-CM | POA: Diagnosis not present

## 2019-03-15 DIAGNOSIS — Z23 Encounter for immunization: Secondary | ICD-10-CM | POA: Diagnosis not present

## 2019-03-23 DIAGNOSIS — Z20822 Contact with and (suspected) exposure to covid-19: Secondary | ICD-10-CM | POA: Diagnosis not present

## 2019-03-26 DIAGNOSIS — M23611 Other spontaneous disruption of anterior cruciate ligament of right knee: Secondary | ICD-10-CM | POA: Diagnosis not present

## 2019-03-26 DIAGNOSIS — M21161 Varus deformity, not elsewhere classified, right knee: Secondary | ICD-10-CM | POA: Diagnosis not present

## 2019-03-26 DIAGNOSIS — M21261 Flexion deformity, right knee: Secondary | ICD-10-CM | POA: Diagnosis not present

## 2019-03-26 DIAGNOSIS — E119 Type 2 diabetes mellitus without complications: Secondary | ICD-10-CM | POA: Diagnosis not present

## 2019-03-26 DIAGNOSIS — Z6837 Body mass index (BMI) 37.0-37.9, adult: Secondary | ICD-10-CM | POA: Diagnosis not present

## 2019-03-26 DIAGNOSIS — M1711 Unilateral primary osteoarthritis, right knee: Secondary | ICD-10-CM | POA: Diagnosis not present

## 2019-03-26 DIAGNOSIS — Z7984 Long term (current) use of oral hypoglycemic drugs: Secondary | ICD-10-CM | POA: Diagnosis not present

## 2019-03-26 DIAGNOSIS — G8918 Other acute postprocedural pain: Secondary | ICD-10-CM | POA: Diagnosis not present

## 2019-03-26 DIAGNOSIS — G4733 Obstructive sleep apnea (adult) (pediatric): Secondary | ICD-10-CM | POA: Diagnosis not present

## 2019-03-26 DIAGNOSIS — Z79899 Other long term (current) drug therapy: Secondary | ICD-10-CM | POA: Diagnosis not present

## 2019-03-26 DIAGNOSIS — E669 Obesity, unspecified: Secondary | ICD-10-CM | POA: Diagnosis not present

## 2019-03-26 DIAGNOSIS — M25761 Osteophyte, right knee: Secondary | ICD-10-CM | POA: Diagnosis not present

## 2019-03-26 DIAGNOSIS — Z96652 Presence of left artificial knee joint: Secondary | ICD-10-CM | POA: Diagnosis not present

## 2019-03-26 DIAGNOSIS — I10 Essential (primary) hypertension: Secondary | ICD-10-CM | POA: Diagnosis not present

## 2019-03-27 DIAGNOSIS — M25761 Osteophyte, right knee: Secondary | ICD-10-CM | POA: Diagnosis not present

## 2019-03-27 DIAGNOSIS — G4733 Obstructive sleep apnea (adult) (pediatric): Secondary | ICD-10-CM | POA: Diagnosis not present

## 2019-03-27 DIAGNOSIS — Z96652 Presence of left artificial knee joint: Secondary | ICD-10-CM | POA: Diagnosis not present

## 2019-03-27 DIAGNOSIS — I10 Essential (primary) hypertension: Secondary | ICD-10-CM | POA: Diagnosis not present

## 2019-03-27 DIAGNOSIS — E119 Type 2 diabetes mellitus without complications: Secondary | ICD-10-CM | POA: Diagnosis not present

## 2019-03-27 DIAGNOSIS — M1711 Unilateral primary osteoarthritis, right knee: Secondary | ICD-10-CM | POA: Diagnosis not present

## 2019-04-09 DIAGNOSIS — M25461 Effusion, right knee: Secondary | ICD-10-CM | POA: Diagnosis not present

## 2019-04-09 DIAGNOSIS — Z96651 Presence of right artificial knee joint: Secondary | ICD-10-CM | POA: Diagnosis not present

## 2019-04-09 DIAGNOSIS — M7731 Calcaneal spur, right foot: Secondary | ICD-10-CM | POA: Diagnosis not present

## 2019-04-10 DIAGNOSIS — M6281 Muscle weakness (generalized): Secondary | ICD-10-CM | POA: Diagnosis not present

## 2019-04-10 DIAGNOSIS — R2689 Other abnormalities of gait and mobility: Secondary | ICD-10-CM | POA: Diagnosis not present

## 2019-04-10 DIAGNOSIS — Z96659 Presence of unspecified artificial knee joint: Secondary | ICD-10-CM | POA: Diagnosis not present

## 2019-04-11 DIAGNOSIS — Z96659 Presence of unspecified artificial knee joint: Secondary | ICD-10-CM | POA: Diagnosis not present

## 2019-04-11 DIAGNOSIS — M6281 Muscle weakness (generalized): Secondary | ICD-10-CM | POA: Diagnosis not present

## 2019-04-11 DIAGNOSIS — R2689 Other abnormalities of gait and mobility: Secondary | ICD-10-CM | POA: Diagnosis not present

## 2019-04-16 DIAGNOSIS — R2689 Other abnormalities of gait and mobility: Secondary | ICD-10-CM | POA: Diagnosis not present

## 2019-04-16 DIAGNOSIS — M6281 Muscle weakness (generalized): Secondary | ICD-10-CM | POA: Diagnosis not present

## 2019-04-16 DIAGNOSIS — Z96659 Presence of unspecified artificial knee joint: Secondary | ICD-10-CM | POA: Diagnosis not present

## 2019-04-17 DIAGNOSIS — M6281 Muscle weakness (generalized): Secondary | ICD-10-CM | POA: Diagnosis not present

## 2019-04-17 DIAGNOSIS — R2689 Other abnormalities of gait and mobility: Secondary | ICD-10-CM | POA: Diagnosis not present

## 2019-04-17 DIAGNOSIS — Z96659 Presence of unspecified artificial knee joint: Secondary | ICD-10-CM | POA: Diagnosis not present

## 2019-04-19 DIAGNOSIS — Z96659 Presence of unspecified artificial knee joint: Secondary | ICD-10-CM | POA: Diagnosis not present

## 2019-04-19 DIAGNOSIS — M6281 Muscle weakness (generalized): Secondary | ICD-10-CM | POA: Diagnosis not present

## 2019-04-19 DIAGNOSIS — R2689 Other abnormalities of gait and mobility: Secondary | ICD-10-CM | POA: Diagnosis not present

## 2019-04-22 DIAGNOSIS — R2689 Other abnormalities of gait and mobility: Secondary | ICD-10-CM | POA: Diagnosis not present

## 2019-04-22 DIAGNOSIS — Z96659 Presence of unspecified artificial knee joint: Secondary | ICD-10-CM | POA: Diagnosis not present

## 2019-04-22 DIAGNOSIS — M6281 Muscle weakness (generalized): Secondary | ICD-10-CM | POA: Diagnosis not present

## 2019-04-24 DIAGNOSIS — M6281 Muscle weakness (generalized): Secondary | ICD-10-CM | POA: Diagnosis not present

## 2019-04-24 DIAGNOSIS — Z96659 Presence of unspecified artificial knee joint: Secondary | ICD-10-CM | POA: Diagnosis not present

## 2019-04-24 DIAGNOSIS — R2689 Other abnormalities of gait and mobility: Secondary | ICD-10-CM | POA: Diagnosis not present

## 2019-04-26 DIAGNOSIS — Z96659 Presence of unspecified artificial knee joint: Secondary | ICD-10-CM | POA: Diagnosis not present

## 2019-04-26 DIAGNOSIS — R2689 Other abnormalities of gait and mobility: Secondary | ICD-10-CM | POA: Diagnosis not present

## 2019-04-26 DIAGNOSIS — M6281 Muscle weakness (generalized): Secondary | ICD-10-CM | POA: Diagnosis not present

## 2019-04-28 DIAGNOSIS — M6281 Muscle weakness (generalized): Secondary | ICD-10-CM | POA: Diagnosis not present

## 2019-04-28 DIAGNOSIS — Z96659 Presence of unspecified artificial knee joint: Secondary | ICD-10-CM | POA: Diagnosis not present

## 2019-04-28 DIAGNOSIS — R2689 Other abnormalities of gait and mobility: Secondary | ICD-10-CM | POA: Diagnosis not present

## 2019-04-30 DIAGNOSIS — N400 Enlarged prostate without lower urinary tract symptoms: Secondary | ICD-10-CM | POA: Diagnosis not present

## 2019-04-30 DIAGNOSIS — Z0000 Encounter for general adult medical examination without abnormal findings: Secondary | ICD-10-CM | POA: Diagnosis not present

## 2019-04-30 DIAGNOSIS — E782 Mixed hyperlipidemia: Secondary | ICD-10-CM | POA: Diagnosis not present

## 2019-04-30 DIAGNOSIS — Z1389 Encounter for screening for other disorder: Secondary | ICD-10-CM | POA: Diagnosis not present

## 2019-04-30 DIAGNOSIS — K219 Gastro-esophageal reflux disease without esophagitis: Secondary | ICD-10-CM | POA: Diagnosis not present

## 2019-04-30 DIAGNOSIS — E559 Vitamin D deficiency, unspecified: Secondary | ICD-10-CM | POA: Diagnosis not present

## 2019-04-30 DIAGNOSIS — M109 Gout, unspecified: Secondary | ICD-10-CM | POA: Diagnosis not present

## 2019-04-30 DIAGNOSIS — M545 Low back pain: Secondary | ICD-10-CM | POA: Diagnosis not present

## 2019-04-30 DIAGNOSIS — E1159 Type 2 diabetes mellitus with other circulatory complications: Secondary | ICD-10-CM | POA: Diagnosis not present

## 2019-04-30 DIAGNOSIS — I10 Essential (primary) hypertension: Secondary | ICD-10-CM | POA: Diagnosis not present

## 2019-04-30 DIAGNOSIS — G4700 Insomnia, unspecified: Secondary | ICD-10-CM | POA: Diagnosis not present

## 2019-04-30 DIAGNOSIS — E039 Hypothyroidism, unspecified: Secondary | ICD-10-CM | POA: Diagnosis not present

## 2019-05-02 DIAGNOSIS — Z96659 Presence of unspecified artificial knee joint: Secondary | ICD-10-CM | POA: Diagnosis not present

## 2019-05-02 DIAGNOSIS — M6281 Muscle weakness (generalized): Secondary | ICD-10-CM | POA: Diagnosis not present

## 2019-05-02 DIAGNOSIS — R2689 Other abnormalities of gait and mobility: Secondary | ICD-10-CM | POA: Diagnosis not present

## 2019-05-03 DIAGNOSIS — M6281 Muscle weakness (generalized): Secondary | ICD-10-CM | POA: Diagnosis not present

## 2019-05-03 DIAGNOSIS — Z96659 Presence of unspecified artificial knee joint: Secondary | ICD-10-CM | POA: Diagnosis not present

## 2019-05-03 DIAGNOSIS — R2689 Other abnormalities of gait and mobility: Secondary | ICD-10-CM | POA: Diagnosis not present

## 2019-05-06 DIAGNOSIS — M6281 Muscle weakness (generalized): Secondary | ICD-10-CM | POA: Diagnosis not present

## 2019-05-06 DIAGNOSIS — R2689 Other abnormalities of gait and mobility: Secondary | ICD-10-CM | POA: Diagnosis not present

## 2019-05-06 DIAGNOSIS — Z96659 Presence of unspecified artificial knee joint: Secondary | ICD-10-CM | POA: Diagnosis not present

## 2019-05-08 DIAGNOSIS — M6281 Muscle weakness (generalized): Secondary | ICD-10-CM | POA: Diagnosis not present

## 2019-05-08 DIAGNOSIS — R2689 Other abnormalities of gait and mobility: Secondary | ICD-10-CM | POA: Diagnosis not present

## 2019-05-08 DIAGNOSIS — Z96659 Presence of unspecified artificial knee joint: Secondary | ICD-10-CM | POA: Diagnosis not present

## 2019-05-09 DIAGNOSIS — M109 Gout, unspecified: Secondary | ICD-10-CM | POA: Diagnosis not present

## 2019-05-09 DIAGNOSIS — E559 Vitamin D deficiency, unspecified: Secondary | ICD-10-CM | POA: Diagnosis not present

## 2019-05-09 DIAGNOSIS — E782 Mixed hyperlipidemia: Secondary | ICD-10-CM | POA: Diagnosis not present

## 2019-05-09 DIAGNOSIS — E039 Hypothyroidism, unspecified: Secondary | ICD-10-CM | POA: Diagnosis not present

## 2019-05-10 DIAGNOSIS — M6281 Muscle weakness (generalized): Secondary | ICD-10-CM | POA: Diagnosis not present

## 2019-05-10 DIAGNOSIS — Z96659 Presence of unspecified artificial knee joint: Secondary | ICD-10-CM | POA: Diagnosis not present

## 2019-05-10 DIAGNOSIS — R2689 Other abnormalities of gait and mobility: Secondary | ICD-10-CM | POA: Diagnosis not present

## 2019-05-14 DIAGNOSIS — Z96659 Presence of unspecified artificial knee joint: Secondary | ICD-10-CM | POA: Diagnosis not present

## 2019-05-14 DIAGNOSIS — R2689 Other abnormalities of gait and mobility: Secondary | ICD-10-CM | POA: Diagnosis not present

## 2019-05-14 DIAGNOSIS — M6281 Muscle weakness (generalized): Secondary | ICD-10-CM | POA: Diagnosis not present

## 2019-05-15 DIAGNOSIS — R2689 Other abnormalities of gait and mobility: Secondary | ICD-10-CM | POA: Diagnosis not present

## 2019-05-15 DIAGNOSIS — M6281 Muscle weakness (generalized): Secondary | ICD-10-CM | POA: Diagnosis not present

## 2019-05-15 DIAGNOSIS — Z96659 Presence of unspecified artificial knee joint: Secondary | ICD-10-CM | POA: Diagnosis not present

## 2019-05-17 DIAGNOSIS — M6281 Muscle weakness (generalized): Secondary | ICD-10-CM | POA: Diagnosis not present

## 2019-05-17 DIAGNOSIS — R2689 Other abnormalities of gait and mobility: Secondary | ICD-10-CM | POA: Diagnosis not present

## 2019-05-17 DIAGNOSIS — Z96659 Presence of unspecified artificial knee joint: Secondary | ICD-10-CM | POA: Diagnosis not present

## 2019-05-20 DIAGNOSIS — R2689 Other abnormalities of gait and mobility: Secondary | ICD-10-CM | POA: Diagnosis not present

## 2019-05-20 DIAGNOSIS — Z96659 Presence of unspecified artificial knee joint: Secondary | ICD-10-CM | POA: Diagnosis not present

## 2019-05-20 DIAGNOSIS — M6281 Muscle weakness (generalized): Secondary | ICD-10-CM | POA: Diagnosis not present

## 2019-05-22 DIAGNOSIS — Z96659 Presence of unspecified artificial knee joint: Secondary | ICD-10-CM | POA: Diagnosis not present

## 2019-05-22 DIAGNOSIS — M6281 Muscle weakness (generalized): Secondary | ICD-10-CM | POA: Diagnosis not present

## 2019-05-22 DIAGNOSIS — R2689 Other abnormalities of gait and mobility: Secondary | ICD-10-CM | POA: Diagnosis not present

## 2019-05-24 DIAGNOSIS — R2689 Other abnormalities of gait and mobility: Secondary | ICD-10-CM | POA: Diagnosis not present

## 2019-05-24 DIAGNOSIS — M6281 Muscle weakness (generalized): Secondary | ICD-10-CM | POA: Diagnosis not present

## 2019-05-24 DIAGNOSIS — Z96659 Presence of unspecified artificial knee joint: Secondary | ICD-10-CM | POA: Diagnosis not present

## 2019-05-28 DIAGNOSIS — M6281 Muscle weakness (generalized): Secondary | ICD-10-CM | POA: Diagnosis not present

## 2019-05-28 DIAGNOSIS — Z96659 Presence of unspecified artificial knee joint: Secondary | ICD-10-CM | POA: Diagnosis not present

## 2019-05-28 DIAGNOSIS — R2689 Other abnormalities of gait and mobility: Secondary | ICD-10-CM | POA: Diagnosis not present

## 2019-05-30 DIAGNOSIS — R2689 Other abnormalities of gait and mobility: Secondary | ICD-10-CM | POA: Diagnosis not present

## 2019-05-30 DIAGNOSIS — Z96659 Presence of unspecified artificial knee joint: Secondary | ICD-10-CM | POA: Diagnosis not present

## 2019-05-30 DIAGNOSIS — M6281 Muscle weakness (generalized): Secondary | ICD-10-CM | POA: Diagnosis not present

## 2019-05-31 DIAGNOSIS — M6281 Muscle weakness (generalized): Secondary | ICD-10-CM | POA: Diagnosis not present

## 2019-05-31 DIAGNOSIS — Z96659 Presence of unspecified artificial knee joint: Secondary | ICD-10-CM | POA: Diagnosis not present

## 2019-05-31 DIAGNOSIS — R2689 Other abnormalities of gait and mobility: Secondary | ICD-10-CM | POA: Diagnosis not present

## 2019-06-03 DIAGNOSIS — R2689 Other abnormalities of gait and mobility: Secondary | ICD-10-CM | POA: Diagnosis not present

## 2019-06-03 DIAGNOSIS — Z96659 Presence of unspecified artificial knee joint: Secondary | ICD-10-CM | POA: Diagnosis not present

## 2019-06-03 DIAGNOSIS — M6281 Muscle weakness (generalized): Secondary | ICD-10-CM | POA: Diagnosis not present

## 2019-06-06 DIAGNOSIS — R2689 Other abnormalities of gait and mobility: Secondary | ICD-10-CM | POA: Diagnosis not present

## 2019-06-06 DIAGNOSIS — M6281 Muscle weakness (generalized): Secondary | ICD-10-CM | POA: Diagnosis not present

## 2019-06-06 DIAGNOSIS — Z96659 Presence of unspecified artificial knee joint: Secondary | ICD-10-CM | POA: Diagnosis not present

## 2019-06-07 DIAGNOSIS — R2689 Other abnormalities of gait and mobility: Secondary | ICD-10-CM | POA: Diagnosis not present

## 2019-06-07 DIAGNOSIS — M6281 Muscle weakness (generalized): Secondary | ICD-10-CM | POA: Diagnosis not present

## 2019-06-07 DIAGNOSIS — Z96659 Presence of unspecified artificial knee joint: Secondary | ICD-10-CM | POA: Diagnosis not present

## 2019-06-19 DIAGNOSIS — M19071 Primary osteoarthritis, right ankle and foot: Secondary | ICD-10-CM | POA: Diagnosis not present

## 2019-06-19 DIAGNOSIS — S93401A Sprain of unspecified ligament of right ankle, initial encounter: Secondary | ICD-10-CM | POA: Diagnosis not present

## 2019-06-19 DIAGNOSIS — M76821 Posterior tibial tendinitis, right leg: Secondary | ICD-10-CM | POA: Diagnosis not present

## 2019-09-02 DIAGNOSIS — G4733 Obstructive sleep apnea (adult) (pediatric): Secondary | ICD-10-CM | POA: Diagnosis not present

## 2019-09-12 DIAGNOSIS — E039 Hypothyroidism, unspecified: Secondary | ICD-10-CM | POA: Diagnosis not present

## 2019-09-12 DIAGNOSIS — I10 Essential (primary) hypertension: Secondary | ICD-10-CM | POA: Diagnosis not present

## 2019-09-12 DIAGNOSIS — D3502 Benign neoplasm of left adrenal gland: Secondary | ICD-10-CM | POA: Diagnosis not present

## 2019-09-12 DIAGNOSIS — Z7984 Long term (current) use of oral hypoglycemic drugs: Secondary | ICD-10-CM | POA: Diagnosis not present

## 2019-09-12 DIAGNOSIS — E119 Type 2 diabetes mellitus without complications: Secondary | ICD-10-CM | POA: Diagnosis not present

## 2019-09-16 DIAGNOSIS — H52221 Regular astigmatism, right eye: Secondary | ICD-10-CM | POA: Diagnosis not present

## 2019-09-16 DIAGNOSIS — I10 Essential (primary) hypertension: Secondary | ICD-10-CM | POA: Diagnosis not present

## 2019-09-16 DIAGNOSIS — H25811 Combined forms of age-related cataract, right eye: Secondary | ICD-10-CM | POA: Diagnosis not present

## 2019-09-16 DIAGNOSIS — Z7984 Long term (current) use of oral hypoglycemic drugs: Secondary | ICD-10-CM | POA: Diagnosis not present

## 2019-09-16 DIAGNOSIS — E119 Type 2 diabetes mellitus without complications: Secondary | ICD-10-CM | POA: Diagnosis not present

## 2019-09-25 DIAGNOSIS — E119 Type 2 diabetes mellitus without complications: Secondary | ICD-10-CM | POA: Diagnosis not present

## 2019-09-25 DIAGNOSIS — Z7984 Long term (current) use of oral hypoglycemic drugs: Secondary | ICD-10-CM | POA: Diagnosis not present

## 2019-09-25 DIAGNOSIS — H25812 Combined forms of age-related cataract, left eye: Secondary | ICD-10-CM | POA: Diagnosis not present

## 2019-09-25 DIAGNOSIS — I10 Essential (primary) hypertension: Secondary | ICD-10-CM | POA: Diagnosis not present

## 2019-09-25 DIAGNOSIS — H52222 Regular astigmatism, left eye: Secondary | ICD-10-CM | POA: Diagnosis not present

## 2019-10-22 DIAGNOSIS — Z23 Encounter for immunization: Secondary | ICD-10-CM | POA: Diagnosis not present

## 2019-11-01 DIAGNOSIS — R609 Edema, unspecified: Secondary | ICD-10-CM | POA: Diagnosis not present

## 2019-11-01 DIAGNOSIS — G4700 Insomnia, unspecified: Secondary | ICD-10-CM | POA: Diagnosis not present

## 2019-11-01 DIAGNOSIS — M109 Gout, unspecified: Secondary | ICD-10-CM | POA: Diagnosis not present

## 2019-11-01 DIAGNOSIS — E1159 Type 2 diabetes mellitus with other circulatory complications: Secondary | ICD-10-CM | POA: Diagnosis not present

## 2019-11-01 DIAGNOSIS — E559 Vitamin D deficiency, unspecified: Secondary | ICD-10-CM | POA: Diagnosis not present

## 2019-11-01 DIAGNOSIS — I2510 Atherosclerotic heart disease of native coronary artery without angina pectoris: Secondary | ICD-10-CM | POA: Diagnosis not present

## 2019-11-01 DIAGNOSIS — I10 Essential (primary) hypertension: Secondary | ICD-10-CM | POA: Diagnosis not present

## 2019-11-01 DIAGNOSIS — M545 Low back pain, unspecified: Secondary | ICD-10-CM | POA: Diagnosis not present

## 2019-11-01 DIAGNOSIS — N400 Enlarged prostate without lower urinary tract symptoms: Secondary | ICD-10-CM | POA: Diagnosis not present

## 2019-11-01 DIAGNOSIS — E782 Mixed hyperlipidemia: Secondary | ICD-10-CM | POA: Diagnosis not present

## 2019-11-01 DIAGNOSIS — E039 Hypothyroidism, unspecified: Secondary | ICD-10-CM | POA: Diagnosis not present

## 2019-11-01 DIAGNOSIS — M25569 Pain in unspecified knee: Secondary | ICD-10-CM | POA: Diagnosis not present

## 2019-11-01 DIAGNOSIS — K219 Gastro-esophageal reflux disease without esophagitis: Secondary | ICD-10-CM | POA: Diagnosis not present

## 2019-11-01 DIAGNOSIS — G4730 Sleep apnea, unspecified: Secondary | ICD-10-CM | POA: Diagnosis not present

## 2019-11-11 ENCOUNTER — Encounter: Payer: Self-pay | Admitting: *Deleted

## 2019-11-11 ENCOUNTER — Encounter: Payer: Self-pay | Admitting: Interventional Cardiology

## 2019-11-11 ENCOUNTER — Ambulatory Visit (INDEPENDENT_AMBULATORY_CARE_PROVIDER_SITE_OTHER): Payer: Medicare Other | Admitting: Interventional Cardiology

## 2019-11-11 ENCOUNTER — Other Ambulatory Visit: Payer: Self-pay

## 2019-11-11 VITALS — BP 118/62 | HR 68 | Ht 71.5 in | Wt 275.5 lb

## 2019-11-11 DIAGNOSIS — Z7189 Other specified counseling: Secondary | ICD-10-CM

## 2019-11-11 DIAGNOSIS — R5383 Other fatigue: Secondary | ICD-10-CM | POA: Diagnosis not present

## 2019-11-11 DIAGNOSIS — I10 Essential (primary) hypertension: Secondary | ICD-10-CM

## 2019-11-11 DIAGNOSIS — I452 Bifascicular block: Secondary | ICD-10-CM | POA: Diagnosis not present

## 2019-11-11 DIAGNOSIS — I493 Ventricular premature depolarization: Secondary | ICD-10-CM | POA: Diagnosis not present

## 2019-11-11 NOTE — Progress Notes (Signed)
Patient ID: Richard Berry, male   DOB: 1946-01-09, 74 y.o.   MRN: [id number] Patient enrolled for [employer] to ship a 7 day ZIO XT long term holter monitor to his home.   Patient to apply monitor 7 days after discontinuing his Metoprolol. Letter with instructions mailed to patients home.

## 2019-11-11 NOTE — Progress Notes (Signed)
Cardiology Office Note:    Date:  11/11/2019   ID:  Richard Berry, DOB Aug 16, 1945, MRN [id number]  PCP:  Antony Contras, MD  Cardiologist:  Sinclair Grooms, MD   Referring MD: Antony Contras, MD   Chief Complaint  Patient presents with  . Hypertension  . Irregular Heart Beat  . Advice Only    Fatigue    History of Present Illness:    Richard Berry is a 74 y.o. male with a hx of morbid obesity, sleep apnea, bifascicular block on EKG, essential hypertension, suspected diastolic heart failure, and additional cardiovascular risk factor diabetes mellitus, diabetes mellitus type II.   He is doing okay with the exception that he has no energy and has been noticing significant reductions in heart rate into the 40s.  Because of this Dr. Moreen Fowler decrease metoprolol succinate to 25 mg/day 8 days ago.  Since decreasing the dose of metoprolol he has not really felt better.  He is also been prescribed Lunesta for insomnia.  He has digital data from his smart phone.  He has had heart rates that ranged from 48 to 59 bpm when he records his blood pressures.  Blood pressures systolic have been running between 135 and 158 mmHg.  He denies chest pain, orthopnea, PND, and no edema.   Past Medical History:  Diagnosis Date  . Diabetes mellitus without complication ([place])   . GERD (gastroesophageal reflux disease)   . Hyperlipidemia   . Hypertension   . Obesity   . Sleep apnea   . Sleep apnea   . Thyroid disease     Past Surgical History:  Procedure Laterality Date  . none      Current Medications: Current Meds  Medication Sig  . allopurinol (ZYLOPRIM) 100 MG tablet Take 100 mg by mouth daily. For gout  . amLODipine (NORVASC) 5 MG tablet Take 1 tablet (5 mg total) by mouth daily.  Marland Kitchen atorvastatin (LIPITOR) 40 MG tablet Take 1 tablet by mouth at bedtime.  . beta carotene w/minerals (OCUVITE) tablet Take 1 tablet by mouth 2 (two) times daily.  . Cholecalciferol (D-3-5) 5000 UNITS capsule Take  5,000 Units by mouth daily.  . cyanocobalamin 1000 MCG tablet Take 1,000 mcg by mouth daily.  . eszopiclone (LUNESTA) 1 MG TABS tablet Take 1 mg by mouth at bedtime as needed for sleep. Take immediately before bedtime  . finasteride (PROSCAR) 5 MG tablet TAKE 1 TABLET BY MOUTH EVERY MORNING  . JARDIANCE 10 MG TABS tablet Take 10 mg by mouth daily.  Marland Kitchen levothyroxine (SYNTHROID, LEVOTHROID) 50 MCG tablet Take 50 mcg by mouth daily.  Marland Kitchen loratadine (CLARITIN) 10 MG tablet Take 10 mg by mouth daily as needed for allergies.  . Misc Natural Products (OSTEO BI-FLEX TRIPLE STRENGTH PO) Take 2 tablets by mouth daily.  . NON FORMULARY Use CPAP machine as directed.  . senna-docusate (SENOKOT-S) 8.6-50 MG tablet Take 1 tablet by mouth at bedtime.  . traMADol (ULTRAM) 50 MG tablet TAKE 1 TABLET BY MOUTH EVERY 6 HOURS AS NEEDED FOR PAIN  . [DISCONTINUED] metoprolol succinate (TOPROL-XL) 50 MG 24 hr tablet Take 25 mg by mouth daily.      Allergies:   Cozaar [losartan potassium], Flomax [tamsulosin hcl], Lotensin [benazepril hcl], Maxzide [triamterene-hctz], and Ppd [tuberculin purified protein derivative]   Social History   Socioeconomic History  . Marital status: Single    Spouse name: Not on file  . Number of children: Not on file  . Years  of education: Not on file  . Highest education level: Not on file  Occupational History  . Not on file  Tobacco Use  . Smoking status: Never Smoker  . Smokeless tobacco: Never Used  Vaping Use  . Vaping Use: Never used  Substance and Sexual Activity  . Alcohol use: No    Alcohol/week: 0.0 standard drinks  . Drug use: Never  . Sexual activity: Not on file  Other Topics Concern  . Not on file  Social History Narrative  . Not on file   Social Determinants of Health   Financial Resource Strain:   . Difficulty of Paying Living Expenses: Not on file  Food Insecurity:   . Worried About Charity fundraiser in the Last Year: Not on file  . Ran Out of Food in  the Last Year: Not on file  Transportation Needs:   . Lack of Transportation (Medical): Not on file  . Lack of Transportation (Non-Medical): Not on file  Physical Activity:   . Days of Exercise per Week: Not on file  . Minutes of Exercise per Session: Not on file  Stress:   . Feeling of Stress : Not on file  Social Connections:   . Frequency of Communication with Friends and Family: Not on file  . Frequency of Social Gatherings with Friends and Family: Not on file  . Attends Religious Services: Not on file  . Active Member of Clubs or Organizations: Not on file  . Attends Archivist Meetings: Not on file  . Marital Status: Not on file     Family History: The patient's family history includes Blindness in his mother; Diabetes in his sister and another family member; Healthy in his brother; Heart attack in his father; Heart disease in his father, mother, sister, and another family member; Hyperlipidemia in an other family member; Hypertension in an other family member.  ROS: He has not had syncope.  When he awakens in the morning after starting the [facility] he feels foggy. Please see the history of present illness.    No other specific complaints other than observation from his wife that he seems fatigued.  He endorses compliance with CPAP.  All other systems reviewed and are negative.  EKGs/Labs/Other Studies Reviewed:    The following studies were reviewed today: No new data  EKG:  EKG right bundle, left anterior hemiblock, first-degree AV block, and interpolated PVCs.  No change when compared to prior.  Recent Labs: No results found for requested labs within last 8760 hours.  Recent Lipid Panel No results found for: CHOL, TRIG, HDL, CHOLHDL, VLDL, LDLCALC, LDLDIRECT  Physical Exam:    VS:  BP 118/62   Pulse 68   Ht 5' 11.5" (1.816 m)   Wt 275 lb 8 oz (125 kg)   SpO2 96%   BMI 37.89 kg/m     Wt Readings from Last 3 Encounters:  11/11/19 275 lb 8 oz (125 kg)    11/06/18 274 lb 12.8 oz (124.6 kg)  08/07/17 285 lb 12.8 oz (129.6 kg)     GEN: Morbidly obese. No acute distress HEENT: Normal NECK: No JVD. LYMPHATICS: No lymphadenopathy CARDIAC:  RRR without murmur, gallop, or edema. VASCULAR:  Normal Pulses. No bruits. RESPIRATORY:  Clear to auscultation without rales, wheezing or rhonchi  ABDOMEN: Soft, non-tender, non-distended, No pulsatile mass, MUSCULOSKELETAL: No deformity  SKIN: Warm and dry NEUROLOGIC:  Alert and oriented x 3 PSYCHIATRIC:  Normal affect   ASSESSMENT:  1. Fatigue, unspecified type   2. Essential hypertension   3. Bifascicular block   4. Premature ventricular contractions   5. Educated about COVID-19 virus infection    PLAN:    In order of problems listed above:  1. Regular exertion.  Rule out chronotropic incompetence.  Discontinue metoprolol succinate completely.  Monitor blood pressure and if persistently above 140/80 mmHg, will need additional therapy but probably not a beta-blocker.  We will do a 7-day monitor to exclude chronotropic incompetence after washout of beta-blocker. 2. If blood pressure remains above 140 will start angiotensin receptor blocker plus minus combined with and HCTZ such as losartan HCTZ 50 mg. 3. He has trifascicular block with right bundle, left anterior hemiblock, and first-degree AV block.  Beta-blocker therapy has been discontinued.  Only on 25 mg/day. 4. Noted on today's EKG.  Monitor will help [place] determine density/burden. 5. He is vaccinated and practicing social distancing.   He will come back to see me in 1 month.  We will monitor how he does off beta-blocker therapy.  If pressure gets too high he would need to have losartan HCTZ added depending upon the level of blood pressure.   Medication Adjustments/Labs and Tests Ordered: Current medicines are reviewed at length with the patient today.  Concerns regarding medicines are outlined above.  Orders Placed This Encounter   Procedures  . LONG TERM MONITOR (3-14 DAYS)  . EKG 12-Lead   No orders of the defined types were placed in this encounter.   Patient Instructions  Medication Instructions:  1) DISCONTINUE Metoprolol  *If you need a refill on your cardiac medications before your next appointment, please call your pharmacy*   Lab Work: None If you have labs (blood work) drawn today and your tests are completely normal, you will receive your results only by: Marland Kitchen MyChart Message (if you have MyChart) OR . A paper copy in the mail If you have any lab test that is abnormal or we need to change your treatment, we will call you to review the results.   Testing/Procedures: Your physician recommends that you wear a monitor for 7 days.  Make sure you are off of the Metoprolol for at least a week before starting the monitor.    Follow-Up: At [facility], you and your health needs are our priority.  As part of our continuing mission to provide you with exceptional heart care, we have created designated Provider Care Teams.  These Care Teams include your primary Cardiologist (physician) and Advanced Practice Providers (APPs -  Physician Assistants and Nurse Practitioners) who all work together to provide you with the care you need, when you need it.  We recommend signing up for the patient portal called "MyChart".  Sign up information is provided on this After Visit Summary.  MyChart is used to connect with patients for Virtual Visits (Telemedicine).  Patients are able to view lab/test results, encounter notes, upcoming appointments, etc.  Non-urgent messages can be sent to your provider as well.   To learn more about what you can do with MyChart, go to [email or web address].    Your next appointment:   1 month(s)  The format for your next appointment:   In Person  Provider:   You may see Sinclair Grooms, MD or one of the following Advanced Practice Providers on your designated Care Team:    Truitt Merle, NP  Cecilie Kicks, NP  Kathyrn Drown, NP    Other Instructions  Signed, Sinclair Grooms, MD  11/11/2019 4:27 PM    [place]

## 2019-11-11 NOTE — Patient Instructions (Signed)
Medication Instructions:  1) DISCONTINUE Metoprolol  *If you need a refill on your cardiac medications before your next appointment, please call your pharmacy*   Lab Work: None If you have labs (blood work) drawn today and your tests are completely normal, you will receive your results only by: Marland Kitchen MyChart Message (if you have MyChart) OR . A paper copy in the mail If you have any lab test that is abnormal or we need to change your treatment, we will call you to review the results.   Testing/Procedures: Your physician recommends that you wear a monitor for 7 days.  Make sure you are off of the Metoprolol for at least a week before starting the monitor.    Follow-Up: At [facility], you and your health needs are our priority.  As part of our continuing mission to provide you with exceptional heart care, we have created designated Provider Care Teams.  These Care Teams include your primary Cardiologist (physician) and Advanced Practice Providers (APPs -  Physician Assistants and Nurse Practitioners) who all work together to provide you with the care you need, when you need it.  We recommend signing up for the patient portal called "MyChart".  Sign up information is provided on this After Visit Summary.  MyChart is used to connect with patients for Virtual Visits (Telemedicine).  Patients are able to view lab/test results, encounter notes, upcoming appointments, etc.  Non-urgent messages can be sent to your provider as well.   To learn more about what you can do with MyChart, go to [email or web address].    Your next appointment:   1 month(s)  The format for your next appointment:   In Person  Provider:   You may see Sinclair Grooms, MD or one of the following Advanced Practice Providers on your designated Care Team:    Truitt Merle, NP  Cecilie Kicks, NP  Kathyrn Drown, NP    Other Instructions

## 2019-11-19 ENCOUNTER — Ambulatory Visit (INDEPENDENT_AMBULATORY_CARE_PROVIDER_SITE_OTHER): Payer: Medicare Other

## 2019-11-19 DIAGNOSIS — I493 Ventricular premature depolarization: Secondary | ICD-10-CM

## 2019-11-19 DIAGNOSIS — I452 Bifascicular block: Secondary | ICD-10-CM

## 2019-12-02 DIAGNOSIS — I452 Bifascicular block: Secondary | ICD-10-CM | POA: Diagnosis not present

## 2019-12-02 DIAGNOSIS — I493 Ventricular premature depolarization: Secondary | ICD-10-CM | POA: Diagnosis not present

## 2019-12-04 ENCOUNTER — Telehealth: Payer: Self-pay | Admitting: Interventional Cardiology

## 2019-12-04 NOTE — Telephone Encounter (Signed)
BP/HR readings sent by pt.  Reviewed by Dr. Tamala Julian and he said all ok.  Called pt to make him aware.  Pt appreciative for call.

## 2019-12-05 NOTE — Progress Notes (Signed)
Cardiology Office Note:    Date:  12/06/2019   ID:  SHIVAN Berry, DOB 1945/08/02, MRN [id number]  PCP:  Antony Contras, MD  Cardiologist:  Sinclair Grooms, MD   Referring MD: Antony Contras, MD   Chief Complaint  Patient presents with  . Congestive Heart Failure  . Hypertension  . Hyperlipidemia    History of Present Illness:    Richard Berry is a 74 y.o. male with a hx of morbid obesity, sleep apnea, bifascicular block on EKG, essential hypertension, suspected diastolic heart failure, and additional cardiovascular risk factor diabetes mellitus type II.  He is doing well.  No chest discomfort or cardiac symptoms.  He is exercising without difficulty.  Breathing is improved.  He has lost some weight.  Past Medical History:  Diagnosis Date  . Diabetes mellitus without complication ([place])   . GERD (gastroesophageal reflux disease)   . Hyperlipidemia   . Hypertension   . Obesity   . Sleep apnea   . Sleep apnea   . Thyroid disease     Past Surgical History:  Procedure Laterality Date  . none      Current Medications: Current Meds  Medication Sig  . allopurinol (ZYLOPRIM) 100 MG tablet Take 100 mg by mouth daily. For gout  . amLODipine (NORVASC) 5 MG tablet Take 1 tablet (5 mg total) by mouth daily.  Marland Kitchen atorvastatin (LIPITOR) 40 MG tablet Take 1 tablet by mouth at bedtime.  . beta carotene w/minerals (OCUVITE) tablet Take 1 tablet by mouth 2 (two) times daily.  . Cholecalciferol (D-3-5) 5000 UNITS capsule Take 5,000 Units by mouth daily.  . cyanocobalamin 1000 MCG tablet Take 1,000 mcg by mouth daily.  . eszopiclone (LUNESTA) 1 MG TABS tablet Take 1 mg by mouth at bedtime as needed for sleep. Take immediately before bedtime  . finasteride (PROSCAR) 5 MG tablet TAKE 1 TABLET BY MOUTH EVERY MORNING  . JARDIANCE 10 MG TABS tablet Take 10 mg by mouth daily.  Marland Kitchen levothyroxine (SYNTHROID, LEVOTHROID) 50 MCG tablet Take 50 mcg by mouth daily.  Marland Kitchen loratadine (CLARITIN) 10 MG  tablet Take 10 mg by mouth daily as needed for allergies.  . Misc Natural Products (OSTEO BI-FLEX TRIPLE STRENGTH PO) Take 2 tablets by mouth daily.  . NON FORMULARY Use CPAP machine as directed.  . senna-docusate (SENOKOT-S) 8.6-50 MG tablet Take 1 tablet by mouth at bedtime.  . traMADol (ULTRAM) 50 MG tablet TAKE 1 TABLET BY MOUTH EVERY 6 HOURS AS NEEDED FOR PAIN     Allergies:   Cozaar [losartan potassium], Flomax [tamsulosin hcl], Lotensin [benazepril hcl], Maxzide [triamterene-hctz], and Ppd [tuberculin purified protein derivative]   Social History   Socioeconomic History  . Marital status: Single    Spouse name: Not on file  . Number of children: Not on file  . Years of education: Not on file  . Highest education level: Not on file  Occupational History  . Not on file  Tobacco Use  . Smoking status: Never Smoker  . Smokeless tobacco: Never Used  Vaping Use  . Vaping Use: Never used  Substance and Sexual Activity  . Alcohol use: No    Alcohol/week: 0.0 standard drinks  . Drug use: Never  . Sexual activity: Not on file  Other Topics Concern  . Not on file  Social History Narrative  . Not on file   Social Determinants of Health   Financial Resource Strain:   . Difficulty of Paying Living Expenses:  Not on file  Food Insecurity:   . Worried About Charity fundraiser in the Last Year: Not on file  . Ran Out of Food in the Last Year: Not on file  Transportation Needs:   . Lack of Transportation (Medical): Not on file  . Lack of Transportation (Non-Medical): Not on file  Physical Activity:   . Days of Exercise per Week: Not on file  . Minutes of Exercise per Session: Not on file  Stress:   . Feeling of Stress : Not on file  Social Connections:   . Frequency of Communication with Friends and Family: Not on file  . Frequency of Social Gatherings with Friends and Family: Not on file  . Attends Religious Services: Not on file  . Active Member of Clubs or Organizations:  Not on file  . Attends Archivist Meetings: Not on file  . Marital Status: Not on file     Family History: The patient's family history includes Blindness in his mother; Diabetes in his sister and another family member; Healthy in his brother; Heart attack in his father; Heart disease in his father, mother, sister, and another family member; Hyperlipidemia in an other family member; Hypertension in an other family member.  ROS:   Please see the history of present illness.    He has had some anxiety about his overall health.  He has been doing well.  He has been monitoring blood pressures at home.  They are improving.  Almost all of the pressures demonstrate systolic blood pressures less than 140 mg.  All other systems reviewed and are negative.  EKGs/Labs/Other Studies Reviewed:    The following studies were reviewed today:  Prolonged Monitor 2021 Narrative & Impression  Basic rhythm is NSR with HR range 45 to 143 PBM. Ave 63 bpm.  Occasional PVC's with 4.7% burden  Rare Ventricular bigeminy and trigeminy  Rare PAC's  No sustained arrhythmia.       Exam Ended: 12/03/19 16:55       EKG:  EKG no new data  Recent Labs: No results found for requested labs within last 8760 hours.  Recent Lipid Panel No results found for: CHOL, TRIG, HDL, CHOLHDL, VLDL, LDLCALC, LDLDIRECT  Physical Exam:    VS:  BP 128/72   Pulse 60   Ht 5' 11.5" (1.816 m)   Wt 273 lb (123.8 kg)   SpO2 96%   BMI 37.55 kg/m     Wt Readings from Last 3 Encounters:  12/06/19 273 lb (123.8 kg)  11/11/19 275 lb 8 oz (125 kg)  11/06/18 274 lb 12.8 oz (124.6 kg)     GEN: Moderate obesity BMI 37.5. No acute distress HEENT: Normal NECK: No JVD. LYMPHATICS: No lymphadenopathy CARDIAC:  RRR without murmur, gallop, or edema. VASCULAR:  Normal Pulses. No bruits. RESPIRATORY:  Clear to auscultation without rales, wheezing or rhonchi  ABDOMEN: Soft, non-tender, non-distended, No pulsatile  mass, MUSCULOSKELETAL: No deformity  SKIN: Warm and dry NEUROLOGIC:  Alert and oriented x 3 PSYCHIATRIC:  Normal affect   ASSESSMENT:    1. Essential hypertension   2. Bifascicular block   3. Obstructive sleep apnea   4. Premature ventricular contractions   5. Educated about COVID-19 virus infection   6. Hyperlipidemia with target LDL less than 70   7. Primary hypertension    PLAN:    In order of problems listed above:  1. Blood pressure is doing well.  Continue salt restricted diet, less than 3000  mg daily. 2. Not reevaluated 3. Compliant with CPAP 4. 4.5% burden of PVCs. 5. He is vaccinated. 6. Continue Lipitor 40 mg/day. 7. Continue amlodipine 5 mg daily  Clinical follow-up in 1 year.  Continue risk factor modification.  No change in current therapy.   Medication Adjustments/Labs and Tests Ordered: Current medicines are reviewed at length with the patient today.  Concerns regarding medicines are outlined above.  No orders of the defined types were placed in this encounter.  No orders of the defined types were placed in this encounter.   Patient Instructions  Medication Instructions:  Your physician recommends that you continue on your current medications as directed. Please refer to the Current Medication list given to you today.  *If you need a refill on your cardiac medications before your next appointment, please call your pharmacy*   Lab Work: None If you have labs (blood work) drawn today and your tests are completely normal, you will receive your results only by: Marland Kitchen MyChart Message (if you have MyChart) OR . A paper copy in the mail If you have any lab test that is abnormal or we need to change your treatment, we will call you to review the results.   Testing/Procedures: None   Follow-Up: At [facility], you and your health needs are our priority.  As part of our continuing mission to provide you with exceptional heart care, we have created  designated Provider Care Teams.  These Care Teams include your primary Cardiologist (physician) and Advanced Practice Providers (APPs -  Physician Assistants and Nurse Practitioners) who all work together to provide you with the care you need, when you need it.  We recommend signing up for the patient portal called "MyChart".  Sign up information is provided on this After Visit Summary.  MyChart is used to connect with patients for Virtual Visits (Telemedicine).  Patients are able to view lab/test results, encounter notes, upcoming appointments, etc.  Non-urgent messages can be sent to your provider as well.   To learn more about what you can do with MyChart, go to [email or web address].    Your next appointment:   12 month(s)  The format for your next appointment:   In Person  Provider:   You may see Sinclair Grooms, MD or one of the following Advanced Practice Providers on your designated Care Team:    Truitt Merle, NP  Cecilie Kicks, NP  Kathyrn Drown, NP    Other Instructions      Signed, Sinclair Grooms, MD  12/06/2019 2:53 PM    [place]

## 2019-12-06 ENCOUNTER — Encounter: Payer: Self-pay | Admitting: Interventional Cardiology

## 2019-12-06 ENCOUNTER — Other Ambulatory Visit: Payer: Self-pay

## 2019-12-06 ENCOUNTER — Ambulatory Visit (INDEPENDENT_AMBULATORY_CARE_PROVIDER_SITE_OTHER): Payer: Medicare Other | Admitting: Interventional Cardiology

## 2019-12-06 VITALS — BP 128/72 | HR 60 | Ht 71.5 in | Wt 273.0 lb

## 2019-12-06 DIAGNOSIS — E785 Hyperlipidemia, unspecified: Secondary | ICD-10-CM

## 2019-12-06 DIAGNOSIS — G4733 Obstructive sleep apnea (adult) (pediatric): Secondary | ICD-10-CM | POA: Diagnosis not present

## 2019-12-06 DIAGNOSIS — I493 Ventricular premature depolarization: Secondary | ICD-10-CM | POA: Diagnosis not present

## 2019-12-06 DIAGNOSIS — I10 Essential (primary) hypertension: Secondary | ICD-10-CM | POA: Diagnosis not present

## 2019-12-06 DIAGNOSIS — Z7189 Other specified counseling: Secondary | ICD-10-CM | POA: Diagnosis not present

## 2019-12-06 DIAGNOSIS — I452 Bifascicular block: Secondary | ICD-10-CM

## 2019-12-06 NOTE — Patient Instructions (Signed)

## 2020-03-10 DIAGNOSIS — D1801 Hemangioma of skin and subcutaneous tissue: Secondary | ICD-10-CM | POA: Diagnosis not present

## 2020-03-10 DIAGNOSIS — L821 Other seborrheic keratosis: Secondary | ICD-10-CM | POA: Diagnosis not present

## 2020-03-26 DIAGNOSIS — E119 Type 2 diabetes mellitus without complications: Secondary | ICD-10-CM | POA: Diagnosis not present

## 2020-03-26 DIAGNOSIS — E039 Hypothyroidism, unspecified: Secondary | ICD-10-CM | POA: Diagnosis not present

## 2020-03-26 DIAGNOSIS — D3502 Benign neoplasm of left adrenal gland: Secondary | ICD-10-CM | POA: Diagnosis not present

## 2020-04-09 DIAGNOSIS — I10 Essential (primary) hypertension: Secondary | ICD-10-CM | POA: Diagnosis not present

## 2020-04-09 DIAGNOSIS — Z125 Encounter for screening for malignant neoplasm of prostate: Secondary | ICD-10-CM | POA: Diagnosis not present

## 2020-04-09 DIAGNOSIS — M109 Gout, unspecified: Secondary | ICD-10-CM | POA: Diagnosis not present

## 2020-04-09 DIAGNOSIS — E669 Obesity, unspecified: Secondary | ICD-10-CM | POA: Diagnosis not present

## 2020-04-09 DIAGNOSIS — E119 Type 2 diabetes mellitus without complications: Secondary | ICD-10-CM | POA: Diagnosis not present

## 2020-04-09 DIAGNOSIS — N400 Enlarged prostate without lower urinary tract symptoms: Secondary | ICD-10-CM | POA: Diagnosis not present

## 2020-04-09 DIAGNOSIS — Z79899 Other long term (current) drug therapy: Secondary | ICD-10-CM | POA: Diagnosis not present

## 2020-04-09 DIAGNOSIS — E039 Hypothyroidism, unspecified: Secondary | ICD-10-CM | POA: Diagnosis not present

## 2020-04-09 DIAGNOSIS — E785 Hyperlipidemia, unspecified: Secondary | ICD-10-CM | POA: Diagnosis not present

## 2020-05-05 ENCOUNTER — Ambulatory Visit (INDEPENDENT_AMBULATORY_CARE_PROVIDER_SITE_OTHER): Payer: Medicare Other | Admitting: Dermatology

## 2020-05-05 ENCOUNTER — Other Ambulatory Visit: Payer: Self-pay

## 2020-05-05 DIAGNOSIS — L853 Xerosis cutis: Secondary | ICD-10-CM

## 2020-05-05 DIAGNOSIS — D692 Other nonthrombocytopenic purpura: Secondary | ICD-10-CM

## 2020-05-05 DIAGNOSIS — L821 Other seborrheic keratosis: Secondary | ICD-10-CM

## 2020-05-05 DIAGNOSIS — D225 Melanocytic nevi of trunk: Secondary | ICD-10-CM

## 2020-05-05 DIAGNOSIS — Z1283 Encounter for screening for malignant neoplasm of skin: Secondary | ICD-10-CM | POA: Diagnosis not present

## 2020-05-05 DIAGNOSIS — L578 Other skin changes due to chronic exposure to nonionizing radiation: Secondary | ICD-10-CM

## 2020-05-05 DIAGNOSIS — R238 Other skin changes: Secondary | ICD-10-CM | POA: Diagnosis not present

## 2020-05-05 DIAGNOSIS — D229 Melanocytic nevi, unspecified: Secondary | ICD-10-CM | POA: Diagnosis not present

## 2020-05-05 DIAGNOSIS — L814 Other melanin hyperpigmentation: Secondary | ICD-10-CM

## 2020-05-05 DIAGNOSIS — D1800 Hemangioma unspecified site: Secondary | ICD-10-CM | POA: Diagnosis not present

## 2020-05-05 NOTE — Patient Instructions (Addendum)
Melanoma ABCDEs  Melanoma is the most dangerous type of skin cancer, and is the leading cause of death from skin disease.  You are more likely to develop melanoma if you:  Have light-colored skin, light-colored eyes, or red or blond hair  Spend a lot of time in the sun  Tan regularly, either outdoors or in a tanning bed  Have had blistering sunburns, especially during childhood  Have a close family member who has had a melanoma  Have atypical moles or large birthmarks  Early detection of melanoma is key since treatment is typically straightforward and cure rates are extremely high if we catch it early.   The first sign of melanoma is often a change in a mole or a new dark spot.  The ABCDE system is a way of remembering the signs of melanoma.  A for asymmetry:  The two halves do not match. B for border:  The edges of the growth are irregular. C for color:  A mixture of colors are present instead of an even brown color. D for diameter:  Melanomas are usually (but not always) greater than 56mm - the size of a pencil eraser. E for evolution:  The spot keeps changing in size, shape, and color.  Please check your skin once per month between visits. You can use a small mirror in front and a large mirror behind you to keep an eye on the back side or your body.   If you see any new or changing lesions before your next follow-up, please call to schedule a visit.  Please continue daily skin protection including broad spectrum sunscreen SPF 30+ to sun-exposed areas, reapplying every 2 hours as needed when you're outdoors.   Staying in the shade or wearing long sleeves, sun glasses (UVA+UVB protection) and wide brim hats (4-inch brim around the entire circumference of the hat) are also recommended for sun protection.   If you have any questions or concerns for your doctor, please call our main line at [phone] and press option 4 to reach your doctor's medical assistant. If no one answers,  please leave a voicemail as directed and we will return your call as soon as possible. Messages left after 4 pm will be answered the following business day.   You may also send [place] a message via [place]. We typically respond to MyChart messages within 1-2 business days.  For prescription refills, please ask your pharmacy to contact our office. Our fax number is [phone].  If you have an urgent issue when the clinic is closed that cannot wait until the next business day, you can page your doctor at the number below.    Please note that while we do our best to be available for urgent issues outside of office hours, we are not available 24/7.   If you have an urgent issue and are unable to reach [place], you may choose to seek medical care at your doctor's office, retail clinic, urgent care center, or emergency room.  If you have a medical emergency, please immediately call 911 or go to the emergency department.  Pager Numbers  - Dr. Nehemiah Massed: [phone]  - Dr. Laurence Ferrari: [phone]  - Dr. Nicole Kindred: [phone]  In the event of inclement weather, please call our main line at [phone] for an update on the status of any delays or closures.  Dermatology Medication Tips: Please keep the boxes that topical medications come in in order to help keep track of the instructions about where and how  to use these. Pharmacies typically print the medication instructions only on the boxes and not directly on the medication tubes.   If your medication is too expensive, please contact our office at [phone] option 4 or send [place] a message through [employer].   We are unable to tell what your co-pay for medications will be in advance as this is different depending on your insurance coverage. However, we may be able to find a substitute medication at lower cost or fill out paperwork to get insurance to cover a needed medication.   If a prior authorization is required to get your medication covered by your  insurance company, please allow [place] 1-2 business days to complete this process.  Drug prices often vary depending on where the prescription is filled and some pharmacies may offer cheaper prices.  The website www.goodrx.com contains coupons for medications through different pharmacies. The prices here do not account for what the cost may be with help from insurance (it may be cheaper with your insurance), but the website can give you the price if you did not use any insurance.  - You can print the associated coupon and take it with your prescription to the pharmacy.  - You may also stop by our office during regular business hours and pick up a GoodRx coupon card.  - If you need your prescription sent electronically to a different pharmacy, notify our office through [facility] or by phone at [phone] option 4.  Recommend OTC Gold Bond Rough and Bumpy to affected areas of arms or other areas of body for dry and bumpy skin .    Gentle Skin Care Guide  1. Bathe no more than once a day.  2. Avoid bathing in hot water  3. Use a mild soap like Dove, Vanicream, Cetaphil, CeraVe. Can use Lever 2000 or Cetaphil antibacterial soap  4. Use soap only where you need it. On most days, use it under your arms, between your legs, and on your feet. Let the water rinse other areas unless visibly dirty.  5. When you get out of the bath/shower, use a towel to gently blot your skin dry, don't rub it.  6. While your skin is still a little damp, apply a moisturizing cream such as Vanicream, CeraVe, Cetaphil, Eucerin, Sarna lotion or plain Vaseline Jelly. For hands apply Neutrogena [place] Hand Cream or Excipial Hand Cream.  7. Reapply moisturizer any time you start to itch or feel dry.  8. Sometimes using free and clear laundry detergents can be helpful. Fabric softener sheets should be avoided. Downy Free & Gentle liquid, or any liquid fabric softener that is free of dyes and perfumes, it  acceptable to use  9. If your doctor has given you prescription creams you may apply moisturizers over them    Seborrheic Keratosis  What causes seborrheic keratoses? Seborrheic keratoses are harmless, common skin growths that first appear during adult life.  As time goes by, more growths appear.  Some people may develop a large number of them.  Seborrheic keratoses appear on both covered and uncovered body parts.  They are not caused by sunlight.  The tendency to develop seborrheic keratoses can be inherited.  They vary in color from skin-colored to gray, brown, or even black.  They can be either smooth or have a rough, warty surface.   Seborrheic keratoses are superficial and look as if they were stuck on the skin.  Under the microscope this type of keratosis looks like layers  upon layers of skin.  That is why at times the top layer may seem to fall off, but the rest of the growth remains and re-grows.    Treatment Seborrheic keratoses do not need to be treated, but can easily be removed in the office.  Seborrheic keratoses often cause symptoms when they rub on clothing or jewelry.  Lesions can be in the way of shaving.  If they become inflamed, they can cause itching, soreness, or burning.  Removal of a seborrheic keratosis can be accomplished by freezing, burning, or surgery. If any spot bleeds, scabs, or grows rapidly, please return to have it checked, as these can be an indication of a skin cancer.

## 2020-05-05 NOTE — Progress Notes (Signed)
New Patient Visit  Subjective  Richard Berry is a 75 y.o. male who presents for the following: New Patient (Initial Visit) (Patient here today as new patient and also for tbse. Patient denies personal or family history of skin cancer. Patient has spot on left ear he would like checked. Spot on right upper arm he would like checked he noticed 3 years ago. Patient states he used fluorouracil on spot for a couple of months. ).  Nothing happened and still there  Patient here for full body skin exam and skin cancer screening.     Objective  Well appearing patient in no apparent distress; mood and affect are within normal limits.  A full examination was performed including scalp, head, eyes, ears, nose, lips, neck, chest, axillae, abdomen, back, buttocks, bilateral upper extremities, bilateral lower extremities, hands, feet, fingers, toes, fingernails, and toenails. All findings within normal limits unless otherwise noted below.  Objective  left mid ear helix: Blanching violaceous macule   Objective  bilateral legs, bilateral arms: Dry flaky skin   Objective  right spinal upper back: 3 mm brown macule with notch   right sternum: 4 mm light pink tan papule   Assessment & Plan  Ven[place] left mid ear helix  Benign, observe.    Xerosis cutis bilateral legs, bilateral arms  Recommend Gold Bond Rough and Bumpy for dry skin on legs and arms 1-2x/day.   Also included Gentle Skin Care Guide in patient's handout   Nevus (2) right sternum; right spinal upper back  Benign-appearing.  Observation.  Call clinic for new or changing lesions.  Recommend daily use of broad spectrum spf 30+ sunscreen to sun-exposed areas.    Lentigines - Scattered tan macules - Due to sun exposure - Benign-appering, observe - Recommend daily broad spectrum sunscreen SPF 30+ to sun-exposed areas, reapply every 2 hours as needed. - Call for any changes  Seborrheic Keratoses - Stuck-on, waxy,  tan-brown papules including right upper arm  - Benign-appearing - Discussed benign etiology and prognosis. - Observe - Call for any changes  Melanocytic Nevi - Tan-brown and/or pink-flesh-colored symmetric macules and papules - Benign appearing on exam today - Observation - Call clinic for new or changing moles - Recommend daily use of broad spectrum spf 30+ sunscreen to sun-exposed areas.   Hemangiomas - Red papules - Discussed benign nature - Observe - Call for any changes  Purpura - Chronic; persistent and recurrent.  Treatable, but not curable. - Violaceous macules and patches on bilateral arms  - Benign - Related to trauma, age, sun damage and/or use of blood thinners, chronic use of topical and/or oral steroids - Observe - Can use OTC arnica containing moisturizer such as Dermend Bruise Formula if desired - Call for worsening or other concerns  Actinic Damage - Chronic condition, secondary to cumulative UV/sun exposure - diffuse scaly erythematous macules with underlying dyspigmentation - Recommend daily broad spectrum sunscreen SPF 30+ to sun-exposed areas, reapply every 2 hours as needed.  - Staying in the shade or wearing long sleeves, sun glasses (UVA+UVB protection) and wide brim hats (4-inch brim around the entire circumference of the hat) are also recommended for sun protection.  - Call for new or changing lesions.  Skin cancer screening performed today.  Return in about 1 year (around 05/05/2021) for tbse.  I, Ruthell Rummage, CMA, am acting as scribe for Brendolyn Patty, MD.  Documentation: I have reviewed the above documentation for accuracy and completeness, and I agree with  the above.  Brendolyn Patty MD

## 2020-05-06 DIAGNOSIS — Z23 Encounter for immunization: Secondary | ICD-10-CM | POA: Diagnosis not present

## 2020-06-26 DIAGNOSIS — F5101 Primary insomnia: Secondary | ICD-10-CM | POA: Diagnosis not present

## 2020-06-26 DIAGNOSIS — R4789 Other speech disturbances: Secondary | ICD-10-CM | POA: Diagnosis not present

## 2020-09-23 DIAGNOSIS — G4733 Obstructive sleep apnea (adult) (pediatric): Secondary | ICD-10-CM | POA: Diagnosis not present

## 2020-09-23 DIAGNOSIS — D3502 Benign neoplasm of left adrenal gland: Secondary | ICD-10-CM | POA: Diagnosis not present

## 2020-09-23 DIAGNOSIS — E039 Hypothyroidism, unspecified: Secondary | ICD-10-CM | POA: Diagnosis not present

## 2020-09-23 DIAGNOSIS — E119 Type 2 diabetes mellitus without complications: Secondary | ICD-10-CM | POA: Diagnosis not present

## 2020-09-23 DIAGNOSIS — Z7984 Long term (current) use of oral hypoglycemic drugs: Secondary | ICD-10-CM | POA: Diagnosis not present

## 2020-10-21 DIAGNOSIS — Z23 Encounter for immunization: Secondary | ICD-10-CM | POA: Diagnosis not present

## 2020-10-30 DIAGNOSIS — E039 Hypothyroidism, unspecified: Secondary | ICD-10-CM | POA: Diagnosis not present

## 2020-10-30 DIAGNOSIS — Z79899 Other long term (current) drug therapy: Secondary | ICD-10-CM | POA: Diagnosis not present

## 2020-10-30 DIAGNOSIS — Z125 Encounter for screening for malignant neoplasm of prostate: Secondary | ICD-10-CM | POA: Diagnosis not present

## 2020-10-30 DIAGNOSIS — R4189 Other symptoms and signs involving cognitive functions and awareness: Secondary | ICD-10-CM | POA: Diagnosis not present

## 2020-10-30 DIAGNOSIS — E7800 Pure hypercholesterolemia, unspecified: Secondary | ICD-10-CM | POA: Diagnosis not present

## 2020-10-30 DIAGNOSIS — M1A0720 Idiopathic chronic gout, left ankle and foot, without tophus (tophi): Secondary | ICD-10-CM | POA: Diagnosis not present

## 2020-11-09 DIAGNOSIS — Z1331 Encounter for screening for depression: Secondary | ICD-10-CM | POA: Diagnosis not present

## 2020-11-09 DIAGNOSIS — E119 Type 2 diabetes mellitus without complications: Secondary | ICD-10-CM | POA: Diagnosis not present

## 2020-11-09 DIAGNOSIS — Z0000 Encounter for general adult medical examination without abnormal findings: Secondary | ICD-10-CM | POA: Diagnosis not present

## 2020-11-09 DIAGNOSIS — I10 Essential (primary) hypertension: Secondary | ICD-10-CM | POA: Diagnosis not present

## 2020-11-09 DIAGNOSIS — Z79899 Other long term (current) drug therapy: Secondary | ICD-10-CM | POA: Diagnosis not present

## 2020-11-09 DIAGNOSIS — E039 Hypothyroidism, unspecified: Secondary | ICD-10-CM | POA: Diagnosis not present

## 2020-11-12 DIAGNOSIS — Z23 Encounter for immunization: Secondary | ICD-10-CM | POA: Diagnosis not present

## 2020-12-15 DIAGNOSIS — Z1211 Encounter for screening for malignant neoplasm of colon: Secondary | ICD-10-CM | POA: Diagnosis not present

## 2020-12-20 LAB — COLOGUARD: COLOGUARD: NEGATIVE

## 2020-12-20 LAB — EXTERNAL GENERIC LAB PROCEDURE: COLOGUARD: NEGATIVE

## 2021-05-11 ENCOUNTER — Encounter: Payer: Medicare Other | Admitting: Dermatology

## 2021-05-25 ENCOUNTER — Ambulatory Visit (INDEPENDENT_AMBULATORY_CARE_PROVIDER_SITE_OTHER): Payer: Medicare Other | Admitting: Dermatology

## 2021-05-25 DIAGNOSIS — L821 Other seborrheic keratosis: Secondary | ICD-10-CM

## 2021-05-25 DIAGNOSIS — L853 Xerosis cutis: Secondary | ICD-10-CM | POA: Diagnosis not present

## 2021-05-25 DIAGNOSIS — D225 Melanocytic nevi of trunk: Secondary | ICD-10-CM | POA: Diagnosis not present

## 2021-05-25 DIAGNOSIS — D1800 Hemangioma unspecified site: Secondary | ICD-10-CM

## 2021-05-25 DIAGNOSIS — D229 Melanocytic nevi, unspecified: Secondary | ICD-10-CM

## 2021-05-25 DIAGNOSIS — Z1283 Encounter for screening for malignant neoplasm of skin: Secondary | ICD-10-CM | POA: Diagnosis not present

## 2021-05-25 DIAGNOSIS — L578 Other skin changes due to chronic exposure to nonionizing radiation: Secondary | ICD-10-CM

## 2021-05-25 DIAGNOSIS — Q809 Congenital ichthyosis, unspecified: Secondary | ICD-10-CM

## 2021-05-25 DIAGNOSIS — L814 Other melanin hyperpigmentation: Secondary | ICD-10-CM

## 2021-05-25 NOTE — Progress Notes (Signed)
? ?  Follow-Up Visit ?  ?Subjective  ?Richard Berry is a 76 y.o. male who presents for the following: Annual Exam (The patient presents for Total-Body Skin Exam (TBSE) for skin cancer screening and mole check.  The patient has spots, moles and lesions to be evaluated, some may be new or changing and the patient has concerns that these could be cancer. Patient with no hx skin cancer. /). ? ? ?The following portions of the chart were reviewed this encounter and updated as appropriate:  ?  ?  ? ?Review of Systems:  No other skin or systemic complaints except as noted in HPI or Assessment and Plan. ? ?Objective  ?Well appearing patient in no apparent distress; mood and affect are within normal limits. ? ?A full examination was performed including scalp, head, eyes, ears, nose, lips, neck, chest, axillae, abdomen, back, buttocks, bilateral upper extremities, bilateral lower extremities, hands, feet, fingers, toes, fingernails, and toenails. All findings within normal limits unless otherwise noted below. ? ?right spinal upper back ?3 mm brown macule with notch  ?  ? ?lower legs ?Severe xerosis with ichthyotic scale  ? ? ? ?Assessment & Plan  ?Nevus ?right spinal upper back ? ?Benign-appearing.  Stable. Observation.  Call clinic for new or changing moles.  Recommend daily use of broad spectrum spf 30+ sunscreen to sun-exposed areas.   ? ?Xerosis cutis ?lower legs ? ?With Ichthyosis  ? ?Recommend mild soap and moisturizing cream 1-2 times daily.   ? ?Recommend starting moisturizer with exfoliant (Urea, Salicylic acid, or Lactic acid) one to two times daily to help smooth rough and bumpy skin.  OTC options include Cetaphil Rough and Bumpy lotion (Urea), Eucerin Roughness Relief lotion or spot treatment cream (Urea), CeraVe SA lotion/cream for Rough and Bumpy skin (Sal Acid), Gold Bond Rough and Bumpy cream (Sal Acid), and AmLactin 12% lotion/cream (Lactic Acid).  If applying in morning, also apply sunscreen to sun-exposed  areas, since these exfoliating moisturizers can increase sensitivity to sun. ? ? ? ?Lentigines ?- Scattered tan macules ?- Due to sun exposure ?- Benign-appearing, observe ?- Recommend daily broad spectrum sunscreen SPF 30+ to sun-exposed areas, reapply every 2 hours as needed. ?- Call for any changes ? ?Seborrheic Keratoses ?- Stuck-on, waxy, tan-brown papules and/or plaques  ?- Benign-appearing ?- Discussed benign etiology and prognosis. ?- Observe ?- Call for any changes ? ?Melanocytic Nevi ?- Tan-brown and/or pink-flesh-colored symmetric macules and papules ?- Benign appearing on exam today ?- Observation ?- Call clinic for new or changing moles ?- Recommend daily use of broad spectrum spf 30+ sunscreen to sun-exposed areas.  ? ?Hemangiomas ?- Red papules ?- Discussed benign nature ?- Observe ?- Call for any changes ? ?Actinic Damage ?- Chronic condition, secondary to cumulative UV/sun exposure ?- diffuse scaly erythematous macules with underlying dyspigmentation ?- Recommend daily broad spectrum sunscreen SPF 30+ to sun-exposed areas, reapply every 2 hours as needed.  ?- Staying in the shade or wearing long sleeves, sun glasses (UVA+UVB protection) and wide brim hats (4-inch brim around the entire circumference of the hat) are also recommended for sun protection.  ?- Call for new or changing lesions. ? ?Skin cancer screening performed today. ? ?Return in about 1 year (around 05/26/2022) for TBSE. ? ?Graciella Belton, RMA, am acting as scribe for Brendolyn Patty, MD . ? ?Documentation: I have reviewed the above documentation for accuracy and completeness, and I agree with the above. ? ?Brendolyn Patty MD  ? ?

## 2021-05-25 NOTE — Patient Instructions (Addendum)
Recommend starting moisturizer with exfoliant (Urea, Salicylic acid, or Lactic acid) one to two times daily to help smooth rough and bumpy skin.  OTC options include Cetaphil Rough and Bumpy lotion (Urea), Eucerin Roughness Relief lotion or spot treatment cream (Urea), CeraVe SA lotion/cream for Rough and Bumpy skin (Sal Acid), Gold Bond Rough and Bumpy cream (Sal Acid), and AmLactin 12% lotion/cream (Lactic Acid).  If applying in morning, also apply sunscreen to sun-exposed areas, since these exfoliating moisturizers can increase sensitivity to sun. ? ?Melanoma ABCDEs ? ?Melanoma is the most dangerous type of skin cancer, and is the leading cause of death from skin disease.  You are more likely to develop melanoma if you: ?Have light-colored skin, light-colored eyes, or red or blond hair ?Spend a lot of time in the sun ?Tan regularly, either outdoors or in a tanning bed ?Have had blistering sunburns, especially during childhood ?Have a close family member who has had a melanoma ?Have atypical moles or large birthmarks ? ?Early detection of melanoma is key since treatment is typically straightforward and cure rates are extremely high if we catch it early.  ? ?The first sign of melanoma is often a change in a mole or a new dark spot.  The ABCDE system is a way of remembering the signs of melanoma. ? ?A for asymmetry:  The two halves do not match. ?B for border:  The edges of the growth are irregular. ?C for color:  A mixture of colors are present instead of an even brown color. ?D for diameter:  Melanomas are usually (but not always) greater than 70m - the size of a pencil eraser. ?E for evolution:  The spot keeps changing in size, shape, and color. ? ?Please check your skin once per month between visits. You can use a small mirror in front and a large mirror behind you to keep an eye on the back side or your body.  ? ?If you see any new or changing lesions before your next follow-up, please call to schedule a  visit. ? ?Please continue daily skin protection including broad spectrum sunscreen SPF 30+ to sun-exposed areas, reapplying every 2 hours as needed when you're outdoors.   ? ?If You Need Anything After Your Visit ? ?If you have any questions or concerns for your doctor, please call our main line at [phone] press option 4 to reach your doctor's medical assistant. If no one answers, please leave a voicemail as directed and we will return your call as soon as possible. Messages left after 4 pm will be answered the following business day.  ? ?You may also send [place] message via MyChart. We typically respond to MyChart messages within 1-2 business days. ? ?For prescription refills, please ask your pharmacy to contact our office. Our fax number is [phone] ? ?If you have an urgent issue when the clinic is closed that cannot wait until the next business day, you can page your doctor at the number below.   ? ?Please note that while we do our best to be available for urgent issues outside of office hours, we are not available 24/7.  ? ?If you have an urgent issue and are unable to reach [place] you may choose to seek medical care at your doctor's office, retail clinic, urgent care center, or emergency room. ? ?If you have a medical emergency, please immediately call 911 or go to the emergency department. ? ?Pager Numbers ? ?- Dr. KNehemiah Massed [phone]? ?- Dr. MLaurence Ferrari  [phone] ? ?- Dr. Nicole Kindred: [phone] ? ?In the event of inclement weather, please call our main line at [phone] for an update on the status of any delays or closures. ? ?Dermatology Medication Tips: ?Please keep the boxes that topical medications come in in order to help keep track of the instructions about where and how to use these. Pharmacies typically print the medication instructions only on the boxes and not directly on the medication tubes.  ? ?If your medication is too expensive, please contact our office at [phone] option 4 or  send [place] a message through [place].  ? ?We are unable to tell what your co-pay for medications will be in advance as this is different depending on your insurance coverage. However, we may be able to find a substitute medication at lower cost or fill out paperwork to get insurance to cover a needed medication.  ? ?If a prior authorization is required to get your medication covered by your insurance company, please allow [place] 1-2 business days to complete this process. ? ?Drug prices often vary depending on where the prescription is filled and some pharmacies may offer cheaper prices. ? ?The website www.goodrx.com contains coupons for medications through different pharmacies. The prices here do not account for what the cost may be with help from insurance (it may be cheaper with your insurance), but the website can give you the price if you did not use any insurance.  ?- You can print the associated coupon and take it with your prescription to the pharmacy.  ?- You may also stop by our office during regular business hours and pick up a GoodRx coupon card.  ?- If you need your prescription sent electronically to a different pharmacy, notify our office through [facility] or by phone at [phone] option 4. ? ? ? ? ?Si Usted Necesita Algo Despu?s de Su Visita ? ?Tambi?n puede enviarnos un mensaje a trav?s de MyChart. Por lo general respondemos a los mensajes de MyChart en el transcurso de 1 a 2 d?as h?biles. ? ?Para renovar recetas, por favor pida a su farmacia que se ponga en contacto con nuestra oficina. Nuestro n?mero de fax es el [phone]. ? ?Si tiene un asunto urgente cuando la cl?nica est? cerrada y que no puede esperar hasta el siguiente d?a h?bil, puede llamar/localizar a su doctor(a) al n?mero que aparece a continuaci?n.  ? ?Por favor, tenga en cuenta que aunque hacemos todo lo posible para estar disponibles para asuntos urgentes fuera del horario de oficina, no estamos disponibles las 24 horas del  d?a, los 7 d?as de la semana.  ? ?Si tiene un problema urgente y no puede comunicarse con nosotros, puede optar por buscar atenci?n m?dica  en el consultorio de su doctor(a), en una cl?nica privada, en un centro de atenci?n urgente o en una sala de emergencias. ? ?Si tiene Engineer, maintenance (IT) m?dica, por favor llame inmediatamente al 911 o vaya a la sala de emergencias. ? ?N?meros de b?per ? ?- Dr. Nehemiah Massed: [phone] ? ?- Dra. Moye: [phone] ? ?- Dra. Nicole Kindred: [phone] ? ?En caso de inclemencias del tiempo, por favor llame a nuestra l?nea principal al [phone] para una actualizaci?n sobre el estado de cualquier retraso o cierre. ? ?Consejos para la medicaci?n en dermatolog?a: ?Por favor, guarde las cajas en las que vienen los medicamentos de uso t?pico para ayudarle a seguir las instrucciones sobre d?nde y c?mo usarlos. Las farmacias generalmente imprimen las instrucciones del medicamento s?lo en las cajas y no directamente  en los tubos del medicamento.  ? ?Si su medicamento es muy caro, por favor, p?ngase en contacto con Zigmund Daniel llamando al [phone] y presione la opci?n 4 o env?enos un mensaje a trav?s de MyChart.  ? ?No podemos decirle cu?l ser? su copago por los medicamentos por adelantado ya que esto es diferente dependiendo de la cobertura de su seguro. Sin embargo, es posible que podamos encontrar un medicamento sustituto a Electrical engineer un formulario para que el seguro cubra el medicamento que se considera necesario.  ? ?Si se requiere Ardelia Mems autorizaci?n previa para que su compa??a de seguros [place] su medicamento, por favor perm?tanos de 1 a 2 d?as h?biles para completar este proceso. ? ?Los precios de los medicamentos var?an con frecuencia dependiendo del Environmental consultant de d?nde se surte la receta y alguna farmacias pueden ofrecer precios m?s baratos. ? ?El sitio web www.goodrx.com tiene cupones para medicamentos de Airline pilot. Los precios aqu? no tienen en cuenta lo que podr?a  costar con la ayuda del seguro (puede ser m?s barato con su seguro), pero el sitio web puede darle el precio si no utiliz? ning?n seguro.  ?- Puede imprimir el cup?n correspondiente y llevarlo con su receta

## 2021-09-21 ENCOUNTER — Ambulatory Visit (INDEPENDENT_AMBULATORY_CARE_PROVIDER_SITE_OTHER): Payer: Medicare Other | Admitting: Dermatology

## 2021-09-21 DIAGNOSIS — R21 Rash and other nonspecific skin eruption: Secondary | ICD-10-CM | POA: Diagnosis not present

## 2021-09-21 MED ORDER — CICLOPIROX OLAMINE 0.77 % EX CREA: TOPICAL_CREAM | CUTANEOUS | 1 refills | Status: AC

## 2021-09-21 NOTE — Patient Instructions (Addendum)
Erythema annulare centrifugum  Due to recent changes in healthcare laws, you may see results of your pathology and/or laboratory studies on MyChart before the doctors have had a chance to review them. We understand that in some cases there may be results that are confusing or concerning to you. Please understand that not all results are received at the same time and often the doctors may need to interpret multiple results in order to provide you with the best plan of care or course of treatment. Therefore, we ask that you please give [place] 2 business days to thoroughly review all your results before contacting the office for clarification. Should we see a critical lab result, you will be contacted sooner.   If You Need Anything After Your Visit  If you have any questions or concerns for your doctor, please call our main line at [phone] and press option 4 to reach your doctor's medical assistant. If no one answers, please leave a voicemail as directed and we will return your call as soon as possible. Messages left after 4 pm will be answered the following business day.   You may also send [place] a message via [place]. We typically respond to MyChart messages within 1-2 business days.  For prescription refills, please ask your pharmacy to contact our office. Our fax number is [phone].  If you have an urgent issue when the clinic is closed that cannot wait until the next business day, you can page your doctor at the number below.    Please note that while we do our best to be available for urgent issues outside of office hours, we are not available 24/7.   If you have an urgent issue and are unable to reach [place], you may choose to seek medical care at your doctor's office, retail clinic, urgent care center, or emergency room.  If you have a medical emergency, please immediately call 911 or go to the emergency department.  Pager Numbers  - Dr. Nehemiah Massed: [phone]  - Dr. Laurence Ferrari:  [phone]  - Dr. Nicole Kindred: [phone]  In the event of inclement weather, please call our main line at [phone] for an update on the status of any delays or closures.  Dermatology Medication Tips: Please keep the boxes that topical medications come in in order to help keep track of the instructions about where and how to use these. Pharmacies typically print the medication instructions only on the boxes and not directly on the medication tubes.   If your medication is too expensive, please contact our office at [phone] option 4 or send [place] a message through [place].   We are unable to tell what your co-pay for medications will be in advance as this is different depending on your insurance coverage. However, we may be able to find a substitute medication at lower cost or fill out paperwork to get insurance to cover a needed medication.   If a prior authorization is required to get your medication covered by your insurance company, please allow [place] 1-2 business days to complete this process.  Drug prices often vary depending on where the prescription is filled and some pharmacies may offer cheaper prices.  The website www.goodrx.com contains coupons for medications through different pharmacies. The prices here do not account for what the cost may be with help from insurance (it may be cheaper with your insurance), but the website can give you the price if you did not use any insurance.  - You can print the associated coupon  and take it with your prescription to the pharmacy.  - You may also stop by our office during regular business hours and pick up a GoodRx coupon card.  - If you need your prescription sent electronically to a different pharmacy, notify our office through [facility] or by phone at [phone] option 4.     Si Usted Necesita Algo Despus de Su Visita  Tambin puede enviarnos un mensaje a travs de Pharmacist, community. Por lo general respondemos a los mensajes de  MyChart en el transcurso de 1 a 2 das hbiles.  Para renovar recetas, por favor pida a su farmacia que se ponga en contacto con nuestra oficina. Harland Dingwall de fax es [place] [phone].  Si tiene un asunto urgente cuando la clnica est cerrada y que no puede esperar hasta el siguiente da hbil, puede llamar/localizar a su doctor(a) al nmero que aparece a continuacin.   Por favor, tenga en cuenta que aunque hacemos todo lo posible para estar disponibles para asuntos urgentes fuera del horario de [place], no estamos disponibles las 24 horas del da, los 7 das de la [place].   Si tiene un problema urgente y no puede comunicarse con nosotros, puede optar por buscar atencin mdica  en el consultorio de su doctor(a), en una clnica privada, en un centro de atencin urgente o en una sala de emergencias.  Si tiene Engineering geologist, por favor llame inmediatamente al 911 o vaya a la sala de emergencias.  Nmeros de bper  - Dr. Nehemiah Massed: [phone]  - Dra. Moye: [phone]  - Dra. Nicole Kindred: [phone]  En caso de inclemencias del [place], por favor llame a Johnsie Kindred principal al [phone] para una actualizacin sobre el [place] de cualquier retraso o cierre.  Consejos para la medicacin en dermatologa: Por favor, guarde las cajas en las que vienen los medicamentos de uso tpico para ayudarle a seguir las instrucciones sobre dnde y cmo usarlos. Las farmacias generalmente imprimen las instrucciones del medicamento slo en las cajas y no directamente en los tubos del [place].   Si su medicamento es muy caro, por favor, pngase en contacto con Zigmund Daniel llamando al [phone] y presione la opcin 4 o envenos un mensaje a travs de Pharmacist, community.   No podemos decirle cul ser su copago por los medicamentos por adelantado ya que esto es diferente dependiendo de la cobertura de su seguro. Sin embargo, es posible que podamos encontrar un medicamento sustituto a Contractor un formulario para que el seguro cubra el medicamento que se considera necesario.   Si se requiere una autorizacin previa para que su compaa de seguros [place] su medicamento, por favor permtanos de 1 a 2 das hbiles para completar este proceso.  Los precios de los medicamentos varan con frecuencia dependiendo del Environmental consultant de dnde se surte la receta y alguna farmacias pueden ofrecer precios ms baratos.  El sitio web www.goodrx.com tiene cupones para medicamentos de Airline pilot. Los precios aqu no tienen en cuenta lo que podra costar con la ayuda del seguro (puede ser ms barato con su seguro), pero el sitio web puede darle el precio si no utiliz Research scientist (physical sciences).  - Puede imprimir el cupn correspondiente y llevarlo con su receta a la farmacia.  - Tambin puede pasar por nuestra oficina durante el horario de atencin regular y Charity fundraiser una tarjeta de cupones de GoodRx.  - Si necesita que su receta se enve electrnicamente a Chiropodist, informe a nuestra oficina a travs de  o por telfono llamando al [phone] y presione la opcin 4.  

## 2021-09-21 NOTE — Progress Notes (Signed)
   Follow-Up Visit   Subjective  Richard Berry is a 76 y.o. male who presents for the following: Rash (Patient here today for rash at left abdomen and right wrist. Patient has tried over the counter tinactin and Lotrimin cream. Patient's PCP then gave him ketoconazole cream and then was given terbinafine 250 mg once daily x 10 days. Rash started several months ago and has continued to worsen. ).  No new medications.  Rash does not itch.  The following portions of the chart were reviewed this encounter and updated as appropriate:       Review of Systems:  No other skin or systemic complaints except as noted in HPI or Assessment and Plan.  Objective  Well appearing patient in no apparent distress; mood and affect are within normal limits.  A focused examination was performed including abdomen, wrist. Relevant physical exam findings are noted in the Assessment and Plan.  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  Pt eats a lot of blue cheese and tomatoes.  Will try to cut back and see if that helps.  ciclopirox (LOPROX) 0.77 % cream - left abdomen, right wrist Apply to affected areas of rash twice daily for 4 weeks.   Return in about 4 weeks (around 10/19/2021) for Rash.  Graciella Belton, RMA, am acting as scribe for Brendolyn Patty, MD .  Documentation: I have reviewed the above documentation for accuracy and completeness, and I agree with the above.  Brendolyn Patty MD

## 2021-11-23 ENCOUNTER — Ambulatory Visit: Payer: Medicare Other | Admitting: Dermatology

## 2022-05-31 ENCOUNTER — Ambulatory Visit (INDEPENDENT_AMBULATORY_CARE_PROVIDER_SITE_OTHER): Payer: Medicare Other | Admitting: Dermatology

## 2022-05-31 ENCOUNTER — Encounter: Payer: Self-pay | Admitting: Dermatology

## 2022-05-31 DIAGNOSIS — L853 Xerosis cutis: Secondary | ICD-10-CM | POA: Diagnosis not present

## 2022-05-31 DIAGNOSIS — L814 Other melanin hyperpigmentation: Secondary | ICD-10-CM

## 2022-05-31 DIAGNOSIS — L578 Other skin changes due to chronic exposure to nonionizing radiation: Secondary | ICD-10-CM | POA: Diagnosis not present

## 2022-05-31 DIAGNOSIS — D692 Other nonthrombocytopenic purpura: Secondary | ICD-10-CM

## 2022-05-31 DIAGNOSIS — D229 Melanocytic nevi, unspecified: Secondary | ICD-10-CM

## 2022-05-31 DIAGNOSIS — L820 Inflamed seborrheic keratosis: Secondary | ICD-10-CM | POA: Diagnosis not present

## 2022-05-31 DIAGNOSIS — Z1283 Encounter for screening for malignant neoplasm of skin: Secondary | ICD-10-CM | POA: Diagnosis not present

## 2022-05-31 DIAGNOSIS — L821 Other seborrheic keratosis: Secondary | ICD-10-CM

## 2022-05-31 NOTE — Patient Instructions (Addendum)
Cryotherapy Aftercare  Wash gently with soap and water everyday.   Apply Vaseline and Band-Aid daily until healed.    Melanoma ABCDEs  Melanoma is the most dangerous type of skin cancer, and is the leading cause of death from skin disease.  You are more likely to develop melanoma if you: Have light-colored skin, light-colored eyes, or red or blond hair Spend a lot of time in the sun Tan regularly, either outdoors or in a tanning bed Have had blistering sunburns, especially during childhood Have a close family member who has had a melanoma Have atypical moles or large birthmarks  Early detection of melanoma is key since treatment is typically straightforward and cure rates are extremely high if we catch it early.   The first sign of melanoma is often a change in a mole or a new dark spot.  The ABCDE system is a way of remembering the signs of melanoma.  A for asymmetry:  The two halves do not match. B for border:  The edges of the growth are irregular. C for color:  A mixture of colors are present instead of an even brown color. D for diameter:  Melanomas are usually (but not always) greater than 6mm - the size of a pencil eraser. E for evolution:  The spot keeps changing in size, shape, and color.  Please check your skin once per month between visits. You can use a small mirror in front and a large mirror behind you to keep an eye on the back side or your body.   If you see any new or changing lesions before your next follow-up, please call to schedule a visit.  Please continue daily skin protection including broad spectrum sunscreen SPF 30+ to sun-exposed areas, reapplying every 2 hours as needed when you're outdoors.    Due to recent changes in healthcare laws, you may see results of your pathology and/or laboratory studies on MyChart before the doctors have had a chance to review them. We understand that in some cases there may be results that are confusing or concerning to you.  Please understand that not all results are received at the same time and often the doctors may need to interpret multiple results in order to provide you with the best plan of care or course of treatment. Therefore, we ask that you please give us 2 business days to thoroughly review all your results before contacting the office for clarification. Should we see a critical lab result, you will be contacted sooner.   If You Need Anything After Your Visit  If you have any questions or concerns for your doctor, please call our main line at [phone] and press option 4 to reach your doctor's medical assistant. If no one answers, please leave a voicemail as directed and we will return your call as soon as possible. Messages left after 4 pm will be answered the following business day.   You may also send us a message via MyChart. We typically respond to MyChart messages within 1-2 business days.  For prescription refills, please ask your pharmacy to contact our office. Our fax number is [phone].  If you have an urgent issue when the clinic is closed that cannot wait until the next business day, you can page your doctor at the number below.    Please note that while we do our best to be available for urgent issues outside of office hours, we are not available 24/7.   If you have an urgent   issue and are unable to reach us, you may choose to seek medical care at your doctor's office, retail clinic, urgent care center, or emergency room.  If you have a medical emergency, please immediately call 911 or go to the emergency department.  Pager Numbers  - Dr. Kowalski: [phone]  - Dr. Moye: [phone]  - Dr. Stewart: [phone]  In the event of inclement weather, please call our main line at [phone] for an update on the status of any delays or closures.  Dermatology Medication Tips: Please keep the boxes that topical medications come in in order to help keep track of the  instructions about where and how to use these. Pharmacies typically print the medication instructions only on the boxes and not directly on the medication tubes.   If your medication is too expensive, please contact our office at [phone] option 4 or send us a message through MyChart.   We are unable to tell what your co-pay for medications will be in advance as this is different depending on your insurance coverage. However, we may be able to find a substitute medication at lower cost or fill out paperwork to get insurance to cover a needed medication.   If a prior authorization is required to get your medication covered by your insurance company, please allow us 1-2 business days to complete this process.  Drug prices often vary depending on where the prescription is filled and some pharmacies may offer cheaper prices.  The website www.goodrx.com contains coupons for medications through different pharmacies. The prices here do not account for what the cost may be with help from insurance (it may be cheaper with your insurance), but the website can give you the price if you did not use any insurance.  - You can print the associated coupon and take it with your prescription to the pharmacy.  - You may also stop by our office during regular business hours and pick up a GoodRx coupon card.  - If you need your prescription sent electronically to a different pharmacy, notify our office through [place] MyChart or by phone at [phone] option 4.  

## 2022-05-31 NOTE — Progress Notes (Signed)
Follow-Up Visit   Subjective  Richard Berry is a 77 y.o. male who presents for the following: Skin Cancer Screening and Full Body Skin Exam  The patient presents for Total-Body Skin Exam (TBSE) for skin cancer screening and mole check. The patient has spots, moles and lesions to be evaluated, some may be new or changing and the patient has concerns that these could be cancer.    The following portions of the chart were reviewed this encounter and updated as appropriate: medications, allergies, medical history  Review of Systems:  No other skin or systemic complaints except as noted in HPI or Assessment and Plan.  Objective  Well appearing patient in no apparent distress; mood and affect are within normal limits.  A full examination was performed including scalp, head, eyes, ears, nose, lips, neck, chest, axillae, abdomen, back, buttocks, bilateral upper extremities, bilateral lower extremities, hands, feet, fingers, toes, fingernails, and toenails. All findings within normal limits unless otherwise noted below.   Relevant physical exam findings are noted in the Assessment and Plan.  mid sternum x 1, L mid cheek x 1 Erythematous stuck-on, waxy papule     Assessment & Plan   LENTIGINES, SEBORRHEIC KERATOSES, HEMANGIOMAS - Benign normal skin lesions - Benign-appearing - Call for any changes  MELANOCYTIC NEVI - Tan-brown and/or pink-flesh-colored symmetric macules and papules - Benign appearing on exam today - Observation - Call clinic for new or changing moles - Recommend daily use of broad spectrum spf 30+ sunscreen to sun-exposed areas.   MELANOCYTIC NEVUS Exam: 3 mm brown macule with notch at right spinal upper back, stable  Treatment Plan: Benign appearing on exam today. Recommend observation. Call clinic for new or changing moles. Recommend daily use of broad spectrum spf 30+ sunscreen to sun-exposed areas.    ACTINIC DAMAGE - Chronic condition, secondary to  cumulative UV/sun exposure - diffuse scaly erythematous macules with underlying dyspigmentation - Recommend daily broad spectrum sunscreen SPF 30+ to sun-exposed areas, reapply every 2 hours as needed.  - Staying in the shade or wearing long sleeves, sun glasses (UVA+UVB protection) and wide brim hats (4-inch brim around the entire circumference of the hat) are also recommended for sun protection.  - Call for new or changing lesions.  SKIN CANCER SCREENING PERFORMED TODAY.  Inflamed seborrheic keratosis mid sternum x 1, L mid cheek x 1  Symptomatic, irritating, patient would like treated.  Recheck sternum on follow up.    Destruction of lesion - mid sternum x 1, L mid cheek x 1  Destruction method: cryotherapy   Informed consent: discussed and consent obtained   Lesion destroyed using liquid nitrogen: Yes   Region frozen until ice ball extended beyond lesion: Yes   Outcome: patient tolerated procedure well with no complications   Post-procedure details: wound care instructions given   Additional details:  Prior to procedure, discussed risks of blister formation, small wound, skin dyspigmentation, or rare scar following cryotherapy. Recommend Vaseline ointment to treated areas while healing.   Purpura - Chronic; persistent and recurrent.  Treatable, but not curable. - Violaceous macules and patches - Benign - Related to trauma, age, sun damage and/or use of blood thinners, chronic use of topical and/or oral steroids - Observe - Can use OTC arnica containing moisturizer such as Dermend Bruise Formula if desired - Call for worsening or other concerns  Xerosis - diffuse xerotic patches - recommend gentle, hydrating skin care - gentle skin care handout given  Return in about 1 year (  around 05/31/2023) for recheck upper sternum on follow up, TBSE.  Anise Salvo, RMA, am acting as scribe for Willeen Niece, MD .   Documentation: I have reviewed the above documentation for  accuracy and completeness, and I agree with the above.  Willeen Niece, MD

## 2023-06-12 ENCOUNTER — Encounter: Payer: Medicare Other | Admitting: Dermatology
